# Patient Record
Sex: Female | Born: 2000 | Race: Black or African American | Hispanic: No | Marital: Single | Smoking: Never smoker
Health system: Southern US, Community
[De-identification: ages and names within clinical notes are randomized; demographics above are authoritative.]

## PROBLEM LIST (undated history)

## (undated) ENCOUNTER — Ambulatory Visit: Admission: EM | Payer: BC Managed Care – PPO | Source: Home / Self Care

## (undated) DIAGNOSIS — J302 Other seasonal allergic rhinitis: Secondary | ICD-10-CM

## (undated) DIAGNOSIS — G43909 Migraine, unspecified, not intractable, without status migrainosus: Secondary | ICD-10-CM

## (undated) DIAGNOSIS — T783XXA Angioneurotic edema, initial encounter: Secondary | ICD-10-CM

## (undated) DIAGNOSIS — L509 Urticaria, unspecified: Secondary | ICD-10-CM

## (undated) HISTORY — DX: Angioneurotic edema, initial encounter: T78.3XXA

## (undated) HISTORY — DX: Urticaria, unspecified: L50.9

## (undated) HISTORY — PX: TYMPANOSTOMY TUBE PLACEMENT: SHX32

## (undated) HISTORY — PX: ADENOIDECTOMY: SUR15

---
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
## 2021-01-04 ENCOUNTER — Other Ambulatory Visit: Payer: Self-pay

## 2021-01-04 ENCOUNTER — Emergency Department (HOSPITAL_COMMUNITY)
Admission: EM | Admit: 2021-01-04 | Discharge: 2021-01-04 | Discharge status: Against Medical Advice | Payer: Self-pay | Attending: Emergency Medicine | Admitting: Emergency Medicine

## 2021-01-04 DIAGNOSIS — R1032 Left lower quadrant pain: Secondary | ICD-10-CM | POA: Insufficient documentation

## 2021-01-04 DIAGNOSIS — R1033 Periumbilical pain: Secondary | ICD-10-CM | POA: Diagnosis not present

## 2021-01-04 DIAGNOSIS — R1012 Left upper quadrant pain: Secondary | ICD-10-CM | POA: Diagnosis not present

## 2021-01-04 DIAGNOSIS — R1084 Generalized abdominal pain: Secondary | ICD-10-CM

## 2021-01-04 DIAGNOSIS — R1011 Right upper quadrant pain: Secondary | ICD-10-CM | POA: Diagnosis present

## 2021-01-04 DIAGNOSIS — R1013 Epigastric pain: Secondary | ICD-10-CM | POA: Insufficient documentation

## 2021-01-04 LAB — COMPREHENSIVE METABOLIC PANEL
ALT: 14 U/L (ref 0–44)
AST: 21 U/L (ref 15–41)
Albumin: 4.1 g/dL (ref 3.5–5.0)
Alkaline Phosphatase: 62 U/L (ref 38–126)
Anion gap: 9 (ref 5–15)
BUN: 7 mg/dL (ref 6–20)
CO2: 25 mmol/L (ref 22–32)
Calcium: 9.4 mg/dL (ref 8.9–10.3)
Chloride: 103 mmol/L (ref 98–111)
Creatinine, Ser: 0.78 mg/dL (ref 0.44–1.00)
GFR, Estimated: >60 mL/min (ref >60)
Glucose, Bld: 94 mg/dL (ref 70–99)
Potassium: 4.3 mmol/L (ref 3.5–5.1)
Sodium: 137 mmol/L (ref 135–145)
Total Bilirubin: 0.4 mg/dL (ref 0.3–1.2)
Total Protein: 7.9 g/dL (ref 6.5–8.1)

## 2021-01-04 LAB — I-STAT BETA HCG BLOOD, ED (MC, WL, AP ONLY): I-stat hCG, quantitative: <5 m[IU]/mL (ref <5)

## 2021-01-04 LAB — URINALYSIS, ROUTINE W REFLEX MICROSCOPIC
Bilirubin Urine: NEGATIVE
Glucose, UA: NEGATIVE mg/dL
Hgb urine dipstick: NEGATIVE
Ketones, ur: NEGATIVE mg/dL
Leukocytes,Ua: NEGATIVE
Nitrite: NEGATIVE
Protein, ur: NEGATIVE mg/dL
Specific Gravity, Urine: 1.021 (ref 1.005–1.030)
pH: 6 (ref 5.0–8.0)

## 2021-01-04 LAB — CBC
HCT: 39.3 % (ref 36.0–46.0)
Hemoglobin: 12.1 g/dL (ref 12.0–15.0)
MCH: 24.3 pg — ABNORMAL LOW (ref 26.0–34.0)
MCHC: 30.8 g/dL (ref 30.0–36.0)
MCV: 79.1 fL — ABNORMAL LOW (ref 80.0–100.0)
Platelets: 290 10*3/uL (ref 150–400)
RBC: 4.97 MIL/uL (ref 3.87–5.11)
RDW: 13.4 % (ref 11.5–15.5)
WBC: 8.9 10*3/uL (ref 4.0–10.5)
nRBC: 0 % (ref 0.0–0.2)

## 2021-01-04 LAB — LIPASE, BLOOD: Lipase: 30 U/L (ref 11–51)

## 2021-01-04 MED ORDER — DICYCLOMINE HCL 10 MG PO CAPS: 10.0000 mg | ORAL_CAPSULE | Freq: Once | ORAL | Status: DC

## 2021-01-04 NOTE — ED Triage Notes (Signed)
Pt c/o lower abdominal pain that began two days ago that radiates to upper abdomen. Last BM was yesterday and it was normal.

## 2021-01-04 NOTE — ED Notes (Signed)
Tried calling pt via phone number on chart, no answer, left voicemail.

## 2021-01-04 NOTE — ED Provider Notes (Signed)
[facility] EMERGENCY DEPARTMENT Provider Note   CSN: [id number] Arrival date & time: 01/04/21  0540     History Chief Complaint  Patient presents with  . Abdominal Pain    [place] is a 20 y.o. female 2 days of abdominal pain. Began as sharp, stabbing RUQ pain. The pain migrated to her left lower quadrant. The pain is sharp. Nothing makes it worse or better. She denies fever, nausea, vomiting, diarrhea or constipation. She has irregular periods due to being on Depo-medrol shot.  She has no urinary sxs or back pain. She denies vaginal sxs. She rates pain as 7/10.    HPI     Past Medical History:  Diagnosis Date  . Angio-edema   . Urticaria     Patient Active Problem List   Diagnosis Date Noted  . Urticaria 11/19/2020  . Other allergic rhinitis 11/19/2020  . Dermatographism 11/19/2020    Past Surgical History:  Procedure Laterality Date  . ADENOIDECTOMY    . TYMPANOSTOMY TUBE PLACEMENT       OB History   No obstetric history on file.     Family History  Problem Relation Age of Onset  . Allergic rhinitis Mother   . Allergic rhinitis Father   . Allergic rhinitis Sister   . Urticaria Sister   . Asthma Brother   . Allergic rhinitis Brother   . Allergic rhinitis Maternal Grandmother   . Urticaria Maternal Grandmother   . Eczema Neg Hx     Social History   Tobacco Use  . Smoking status: Never Smoker  . Smokeless tobacco: Never Used  Vaping Use  . Vaping Use: Never used  Substance Use Topics  . Alcohol use: Never  . Drug use: Never    Home Medications Prior to Admission medications   Medication Sig Start Date End Date Taking? Authorizing Provider  cetirizine (ZYRTEC ALLERGY) 10 MG tablet Take 1 tablet (10 mg total) by mouth daily. Patient taking differently: Take 10 mg by mouth 2 (two) times daily. 12/23/20  Yes Ellamae Sia, DO  SUMAtriptan (IMITREX) 100 MG tablet Take 100 mg by mouth once as needed for migraine. May repeat in  2 hours if headache persists or recurs.   Yes [provider]  topiramate (TOPAMAX) 50 MG tablet Take 50 mg by mouth daily.   Yes [provider]  montelukast (SINGULAIR) 10 MG tablet Take 1 tablet (10 mg total) by mouth at bedtime. Patient not taking: Reported on 01/04/2021 12/23/20   Ellamae Sia, DO    Allergies    Patient has no known allergies.  Review of Systems   Review of Systems Ten systems reviewed and are negative for acute change, except as noted in the HPI.   Physical Exam Updated Vital Signs BP 124/86 (BP Location: Left Arm)   Pulse 83   Temp 98.7 F (37.1 C) (Oral)   Resp 16   Ht 5\' 7"  (1.702 m)   Wt 75.8 kg   LMP 11/15/2020   SpO2 99%   BMI 26.16 kg/m   Physical Exam Vitals and nursing note reviewed.  Constitutional:      General: She is not in acute distress.    Appearance: She is well-developed. She is not diaphoretic.  HENT:     Head: Normocephalic and atraumatic.  Eyes:     General: No scleral icterus.    Conjunctiva/sclera: Conjunctivae normal.  Cardiovascular:     Rate and Rhythm: Normal rate and regular rhythm.  Heart sounds: Normal heart sounds. No murmur heard. No friction rub. No gallop.   Pulmonary:     Effort: Pulmonary effort is normal. No respiratory distress.     Breath sounds: Normal breath sounds.  Abdominal:     General: Bowel sounds are normal. There is no distension.     Palpations: Abdomen is soft. There is no mass.     Tenderness: There is abdominal tenderness in the right upper quadrant, epigastric area, periumbilical area, suprapubic area, left upper quadrant and left lower quadrant. There is no guarding.    Musculoskeletal:     Cervical back: Normal range of motion.  Skin:    General: Skin is warm and dry.  Neurological:     Mental Status: She is alert and oriented to person, place, and time.  Psychiatric:        Behavior: Behavior normal.     ED Results / Procedures / Treatments   Labs (all labs  ordered are listed, but only abnormal results are displayed) Labs Reviewed  CBC - Abnormal; Notable for the following components:      Result Value   MCV 79.1 (*)    MCH 24.3 (*)    All other components within normal limits  LIPASE, BLOOD  COMPREHENSIVE METABOLIC PANEL  URINALYSIS, ROUTINE W REFLEX MICROSCOPIC  I-STAT BETA HCG BLOOD, ED ([facility], [facility], [facility] ONLY)    EKG None  Radiology No results found.  Procedures Procedures   Medications Ordered in ED Medications - No data to display  ED Course  I have reviewed the triage vital signs and the nursing notes.  Pertinent labs & imaging results that were available during my care of the patient were reviewed by me and considered in my medical decision making (see chart for details).    MDM Rules/Calculators/A&P                         Patient seen at bedside.  She has fairly benign abdominal exam and pain is mostly generalized. I reviewed the patient's laboratory results which show CBC which shows no elevated white blood cell count, CMP within normal limits, normal urine level, i-STAT hCG is negative.  Patient is well-appearing, afebrile, she has normal vital signs, no active vomiting or diarrhea.  Given the fact that the patient is tender in her left lower quadrant I explained that it we would likely need to do a pelvic examination.  Patient has not had a pelvic examination before but states that it is okay to perform the exam.  I walked the patient through the process of the speculum and bimanual examination. I then ordered standard pelvic labs and checked in with the nurse to make sure she would message me when the exam was ready to go.  She states that the patient appears to have eloped from the room with all of her belongings.  She tried calling the patient who did not pick up her phone.   Final Clinical Impression(s) / ED Diagnoses Final diagnoses:  None    Rx / DC Orders ED Discharge Orders    None       Arthor Captain,  PA-C 01/04/21 0747    Rolan Bucco, MD 01/04/21 [phone]

## 2021-01-04 NOTE — ED Notes (Signed)
PT not in room, or any of the bathrooms in this section of the unit. No belongings left in the room. Believe pt has eloped.

## 2021-01-09 LAB — CBC WITH DIFFERENTIAL/PLATELET
Basophils Absolute: 0 10*3/uL (ref 0.0–0.2)
Basos: 1 %
EOS (ABSOLUTE): 0.2 10*3/uL (ref 0.0–0.4)
Eos: 4 %
Hematocrit: 41.6 % (ref 34.0–46.6)
Hemoglobin: 13.2 g/dL (ref 11.1–15.9)
Immature Grans (Abs): 0 10*3/uL (ref 0.0–0.1)
Immature Granulocytes: 0 %
Lymphocytes Absolute: 2.2 10*3/uL (ref 0.7–3.1)
Lymphs: 51 %
MCH: 24.4 pg — ABNORMAL LOW (ref 26.6–33.0)
MCHC: 31.7 g/dL (ref 31.5–35.7)
MCV: 77 fL — ABNORMAL LOW (ref 79–97)
Monocytes Absolute: 0.3 10*3/uL (ref 0.1–0.9)
Monocytes: 7 %
Neutrophils Absolute: 1.6 10*3/uL (ref 1.4–7.0)
Neutrophils: 37 %
Platelets: 215 10*3/uL (ref 150–450)
RBC: 5.4 x10E6/uL — ABNORMAL HIGH (ref 3.77–5.28)
RDW: 13 % (ref 11.7–15.4)
WBC: 4.2 10*3/uL (ref 3.4–10.8)

## 2021-01-09 LAB — ALLERGENS W/TOTAL IGE AREA 2
Alternaria Alternata IgE: <0.1 kU/L
Aspergillus Fumigatus IgE: <0.1 kU/L
Bermuda Grass IgE: 0.36 kU/L — AB
Cat Dander IgE: <0.1 kU/L
Cedar, Mountain IgE: <0.1 kU/L
Cladosporium Herbarum IgE: <0.1 kU/L
Cockroach, German IgE: 0.63 kU/L — AB
Common Silver Birch IgE: <0.1 kU/L
Cottonwood IgE: <0.1 kU/L
D Farinae IgE: 1.35 kU/L — AB
D Pteronyssinus IgE: 1.38 kU/L — AB
Dog Dander IgE: <0.1 kU/L
Elm, American IgE: <0.1 kU/L
Johnson Grass IgE: 0.13 kU/L — AB
Maple/Box Elder IgE: 0.2 kU/L — AB
Mouse Urine IgE: <0.1 kU/L
Oak, White IgE: <0.1 kU/L
Pecan, Hickory IgE: <0.1 kU/L
Penicillium Chrysogen IgE: <0.1 kU/L
Pigweed, Rough IgE: <0.1 kU/L
Ragweed, Short IgE: 0.2 kU/L — AB
Sheep Sorrel IgE Qn: <0.1 kU/L
Timothy Grass IgE: 0.97 kU/L — AB
White Mulberry IgE: <0.1 kU/L

## 2021-01-09 LAB — ALPHA-GAL PANEL
Allergen Lamb IgE: <0.1 kU/L
Beef IgE: 0.1 kU/L — AB
IgE (Immunoglobulin E), Serum: 439 IU/mL (ref 6–495)
O215-IgE Alpha-Gal: <0.1 kU/L
Pork IgE: 0.31 kU/L — AB

## 2021-01-09 LAB — COMPREHENSIVE METABOLIC PANEL
ALT: 12 IU/L (ref 0–32)
AST: 28 IU/L (ref 0–40)
Albumin/Globulin Ratio: 1.2 (ref 1.2–2.2)
Albumin: 4.4 g/dL (ref 3.9–5.0)
Alkaline Phosphatase: 80 IU/L (ref 42–106)
BUN/Creatinine Ratio: 12 (ref 9–23)
BUN: 10 mg/dL (ref 6–20)
Bilirubin Total: <0.2 mg/dL (ref 0.0–1.2)
CO2: 22 mmol/L (ref 20–29)
Calcium: 9.9 mg/dL (ref 8.7–10.2)
Chloride: 101 mmol/L (ref 96–106)
Creatinine, Ser: 0.82 mg/dL (ref 0.57–1.00)
Globulin, Total: 3.7 g/dL (ref 1.5–4.5)
Glucose: 85 mg/dL (ref 65–99)
Potassium: 4.7 mmol/L (ref 3.5–5.2)
Sodium: 138 mmol/L (ref 134–144)
Total Protein: 8.1 g/dL (ref 6.0–8.5)
eGFR: 106 mL/min/{1.73_m2} (ref >59)

## 2021-01-09 LAB — THYROID CASCADE PROFILE: TSH: 1.32 u[IU]/mL (ref 0.450–4.500)

## 2021-01-09 LAB — ANA W/REFLEX: Anti Nuclear Antibody (ANA): NEGATIVE

## 2021-01-09 LAB — CHRONIC URTICARIA: cu index: 7.3 (ref <10)

## 2021-01-09 LAB — TRYPTASE: Tryptase: 7.3 ug/L (ref 2.2–13.2)

## 2021-03-03 ENCOUNTER — Ambulatory Visit: Payer: BC Managed Care – PPO | Admitting: Allergy

## 2021-03-03 DIAGNOSIS — J309 Allergic rhinitis, unspecified: Secondary | ICD-10-CM

## 2021-03-03 NOTE — Progress Notes (Deleted)
RE: Sonia Cruz MRN: [id number] DOB: 23-Jun-2001 Date of Telemedicine Visit: 03/03/2021  Referring provider: No ref. provider found Primary care provider: Patient, No Pcp Per (Inactive)  Chief Complaint: No chief complaint on file.   Telemedicine Follow Up Visit via Telephone: I connected with Sonia Cruz for a follow up on 03/03/21 by telephone and verified that I am speaking with the correct person using two identifiers.   I discussed the limitations, risks, security and privacy concerns of performing an evaluation and management service by telephone and the availability of in person appointments. I also discussed with the patient that there may be a patient responsible charge related to this service. The patient expressed understanding and agreed to proceed.  Patient is at home/work.  Provider is at home.  Visit start time: *** Visit end time: *** Insurance consent/check in by: front desk Medical consent and medical assistant/nurse: Ashleigh F.   History of Present Illness: She is a 20 y.o. female, who is being followed for urticaria, dermatographism, allergic rhinitis. Her previous allergy office visit was on 12/23/2020 with Dr. Selena Batten. Today is a regular follow up visit.  Please call patient. Blood work was positive to dust mites, grass pollen, cockroach.  Borderline positive to tree pollen and ragweed pollen. Start environmental controlmeasures - please mail to her home.    Blood count, kidney function, liver function, electrolytes, thyroid, autoimmune screener, chronic urticaria index (checks for autoantibodies that trigger mast cells), tryptase (checks for mast cell issues) and alpha gal (checks for redmeat allergy) were all normal which is great.    Borderline positive to pork - does she eat pork right now? Any symptoms?   Urticaria Past history - Pruritic rash started 1 month ago - worse on arms, neck, lowe back and inner thigh. Worse after stroking area. Takes benadryl  prn with good benefit. Denies changes in diet, meds, personal care products or recent infections. Referral note state she had prior COVID-19 infection.  Interim history - resolved with zyrtec 20mg  once a day. Did not get bloodwork yet. DECREASE zyrtec to 10mg  once a day. If hives are not controlled or causes drowsiness let know. Avoid the following potential triggers: alcohol, tight clothing, NSAIDs, hot showers and getting overheated. Get bloodwork to rule out other etiologies.    Dermatographism See assessment and plan as above.   Other allergic rhinitis Past history - Rhinoconjunctivitis symptoms mainly in the winter months.  No prior allergy testing.   Interim history - symptoms flared. Does not tolerate nasal sprays due to epistaxis.  Check environmental allergy panel via bloodwork. Continue with zyrtec 10mg  daily as above.  Nasal saline spray (i.e., Simply Saline) or nasal saline lavage (i.e., NeilMed) is recommended as needed and prior to medicated nasal sprays. Start Singulair (montelukast) 10mg  daily at night. Cautioned that in some children/adults can experience behavioral changes including hyperactivity, agitation, depression, sleep disturbances and suicidal ideations. These side effects are rare, but if you notice them you should notify me and discontinue Singulair (montelukast).   Return in about 2 months (around 02/22/2021).  Assessment and Plan: Sonia Cruz is a 20 y.o. female with: No problem-specific Assessment & Plan notes found for this encounter.  No follow-ups on file.  No orders of the defined types were placed in this encounter.  Lab Orders  No laboratory test(s) ordered today    Diagnostics: None.  Medication List:  Current Outpatient Medications  Medication Sig Dispense Refill   cetirizine (ZYRTEC ALLERGY) 10 MG tablet Take 1 tablet (  10 mg total) by mouth daily. (Patient taking differently: Take 10 mg by mouth 2 (two) times daily.) 30 tablet 5    montelukast (SINGULAIR) 10 MG tablet Take 1 tablet (10 mg total) by mouth at bedtime. (Patient not taking: Reported on 01/04/2021) 30 tablet 5   SUMAtriptan (IMITREX) 100 MG tablet Take 100 mg by mouth once as needed for migraine. May repeat in 2 hours if headache persists or recurs.     topiramate (TOPAMAX) 50 MG tablet Take 50 mg by mouth daily.     No current facility-administered medications for this visit.   Allergies: No Known Allergies I reviewed her past medical history, social history, family history, and environmental history and no significant changes have been reported from her previous visit.  Review of Systems  Constitutional:  Negative for appetite change, chills, fever and unexpected weight change.  HENT:  Positive for congestion and rhinorrhea.   Eyes:  Negative for itching.  Respiratory:  Negative for cough, chest tightness, shortness of breath and wheezing.   Cardiovascular:  Negative for chest pain.  Gastrointestinal:  Negative for abdominal pain.  Genitourinary:  Negative for difficulty urinating.  Skin:  Negative for rash.  Allergic/Immunologic: Positive for environmental allergies.  Neurological:  Negative for headaches.  Objective: Physical Exam Not obtained as encounter was done via telephone.   Previous notes and tests were reviewed.  I discussed the assessment and treatment plan with the patient. The patient was provided an opportunity to ask questions and all were answered. The patient agreed with the plan and demonstrated an understanding of the instructions. After visit summary/patient instructions available via  mail .   The patient was advised to call back or seek an in-person evaluation if the symptoms worsen or if the condition fails to improve as anticipated.  I provided *** minutes of non-face-to-face time during this encounter.  It was my pleasure to participate in Sonia Cruz's care today. Please feel free to contact me with any questions or  concerns.   Sincerely,  Wyline Mood, DO Allergy & Immunology  [facility] office: [phone] [facility] office: [phone]

## 2022-01-02 ENCOUNTER — Emergency Department (HOSPITAL_BASED_OUTPATIENT_CLINIC_OR_DEPARTMENT_OTHER)
Admission: EM | Admit: 2022-01-02 | Discharge: 2022-01-02 | Disposition: A | Discharge status: Home / Self Care | Payer: BC Managed Care – PPO | Attending: Emergency Medicine | Admitting: Emergency Medicine

## 2022-01-02 ENCOUNTER — Other Ambulatory Visit: Payer: Self-pay

## 2022-01-02 ENCOUNTER — Encounter (HOSPITAL_BASED_OUTPATIENT_CLINIC_OR_DEPARTMENT_OTHER): Payer: Self-pay

## 2022-01-02 ENCOUNTER — Emergency Department (HOSPITAL_BASED_OUTPATIENT_CLINIC_OR_DEPARTMENT_OTHER): Payer: BC Managed Care – PPO

## 2022-01-02 DIAGNOSIS — J40 Bronchitis, not specified as acute or chronic: Secondary | ICD-10-CM | POA: Insufficient documentation

## 2022-01-02 DIAGNOSIS — R051 Acute cough: Secondary | ICD-10-CM

## 2022-01-02 HISTORY — DX: Other seasonal allergic rhinitis: J30.2

## 2022-01-02 HISTORY — DX: Migraine, unspecified, not intractable, without status migrainosus: G43.909

## 2022-01-02 LAB — CBC WITH DIFFERENTIAL/PLATELET
Abs Immature Granulocytes: 0.03 10*3/uL (ref 0.00–0.07)
Basophils Absolute: 0.1 10*3/uL (ref 0.0–0.1)
Basophils Relative: 1 %
Eosinophils Absolute: 0.2 10*3/uL (ref 0.0–0.5)
Eosinophils Relative: 3 %
HCT: 40.1 % (ref 36.0–46.0)
Hemoglobin: 12.8 g/dL (ref 12.0–15.0)
Immature Granulocytes: 0 %
Lymphocytes Relative: 27 %
Lymphs Abs: 2.3 10*3/uL (ref 0.7–4.0)
MCH: 25.7 pg — ABNORMAL LOW (ref 26.0–34.0)
MCHC: 31.9 g/dL (ref 30.0–36.0)
MCV: 80.4 fL (ref 80.0–100.0)
Monocytes Absolute: 0.8 10*3/uL (ref 0.1–1.0)
Monocytes Relative: 9 %
Neutro Abs: 5.2 10*3/uL (ref 1.7–7.7)
Neutrophils Relative %: 60 %
Platelets: 315 10*3/uL (ref 150–400)
RBC: 4.99 MIL/uL (ref 3.87–5.11)
RDW: 12.8 % (ref 11.5–15.5)
WBC: 8.7 10*3/uL (ref 4.0–10.5)
nRBC: 0 % (ref 0.0–0.2)

## 2022-01-02 LAB — BASIC METABOLIC PANEL
Anion gap: 6 (ref 5–15)
BUN: 12 mg/dL (ref 6–20)
CO2: 25 mmol/L (ref 22–32)
Calcium: 9.1 mg/dL (ref 8.9–10.3)
Chloride: 106 mmol/L (ref 98–111)
Creatinine, Ser: 0.79 mg/dL (ref 0.44–1.00)
GFR, Estimated: >60 mL/min (ref >60)
Glucose, Bld: 96 mg/dL (ref 70–99)
Potassium: 3.7 mmol/L (ref 3.5–5.1)
Sodium: 137 mmol/L (ref 135–145)

## 2022-01-02 LAB — D-DIMER, QUANTITATIVE: D-Dimer, Quant: <0.27 ug/mL-FEU (ref 0.00–0.50)

## 2022-01-02 LAB — TROPONIN I (HIGH SENSITIVITY): Troponin I (High Sensitivity): 4 ng/L (ref <18)

## 2022-01-02 MED ORDER — ALBUTEROL SULFATE HFA 108 (90 BASE) MCG/ACT IN AERS
2.0000 | INHALATION_SPRAY | RESPIRATORY_TRACT | Status: DC | PRN
  Administered 2022-01-02: 2 via RESPIRATORY_TRACT
  Filled 2022-01-02: qty 6.7

## 2022-01-02 NOTE — ED Triage Notes (Signed)
Pt reports allergies last week . Now has a productive cough. Chest feels tight with pain in center that's goes into back.  ?

## 2022-01-02 NOTE — ED Notes (Signed)
Pt A&Ox4 ambulatory at d/c with independent steady gait. Pt verbalized understanding of d/c instructions and follow up care. 

## 2022-01-02 NOTE — Discharge Instructions (Signed)
Please read and follow all provided instructions. ? ?Your diagnoses today include:  ?1. Bronchitis   ?2. Acute cough   ? ? ?Tests performed today include: ?Chest x-ray - does not show any pneumonia or other problems ?EKG: Shows nonspecific T wave changes but no signs of heart attack or other problems with the heart ?Complete blood cell count: No problems ?Basic metabolic panel: No problems ?Cardiac enzymes (blood test looking for stress on the heart): Was normal ?Vital signs. See below for your results today.  ? ?Medications prescribed:  ?Albuterol inhaler - medication that opens up your airway ? ?Use inhaler as follows: 1-2 puffs with spacer every 4 hours as needed for wheezing, cough, or shortness of breath.  ? ?Take any prescribed medications only as directed. ? ?Home care instructions:  ?Follow any educational materials contained in this packet. ? ?Continue your home antihistamine and use any over-the-counter medications as directed for symptom control. ? ?Follow-up instructions: ?Please follow-up with your primary care provider in the next 5 days for further evaluation of your symptoms and a recheck if you are not feeling better.  ? ?Return instructions:  ?Please return to the Emergency Department if you experience worsening symptoms. ?Please return with worsening wheezing, shortness of breath, or difficulty breathing. ?Return with persistent fever above 101F.  ?Please return if you have any other emergent concerns. ? ?Additional Information: ? ?Your vital signs today were: ?BP 119/74 (BP Location: Left Arm)   Pulse (!) 102   Temp 98.5 ?F (36.9 ?C) (Oral)   Resp 18   Ht 5\' 6"  (1.676 m)   Wt 82.6 kg   LMP 12/29/2021   SpO2 99%   BMI 29.38 kg/m?  ?If your blood pressure (BP) was elevated above 135/85 this visit, please have this repeated by your doctor within one month. ?-------------- ? ?

## 2022-01-02 NOTE — ED Provider Notes (Signed)
?[facility] EMERGENCY DEPARTMENT ?Provider Note ? ? ?CSN: [id number] ?Arrival date & time: 01/02/22  1559 ? ?  ? ?History ? ?Chief Complaint  ?Patient presents with  ? Cough  ? ? ?Sonia Cruz is a 21 y.o. female. ? ?Patient with no significant past medical history, history of seasonal allergies on Xyzal presents to the emergency department for evaluation of chest pain and cough.  Patient reports having sinusitis type symptoms about a week to 2 weeks ago which improved spontaneously.  She has developed a sharp central chest pain which radiates to her back at times.  It is constant but worse with coughing and taking a deep breath.  She has had some yellowish sputum production without blood.  She is not short of breath.  No fevers, nausea or vomiting. Patient denies risk factors for pulmonary embolism including: unilateral leg swelling, history of DVT/PE/other blood clots, use of exogenous hormones, recent immobilizations, recent surgery, recent travel (>4hr segment), malignancy, hemoptysis.  Vapes occasionally, no tobacco or marijuana use.  Family history of stroke and heart attack in a paternal grandfather at around age 7. ? ? ?  ? ?Home Medications ?Prior to Admission medications   ?Medication Sig Start Date End Date Taking? Authorizing Provider  ?cetirizine (ZYRTEC ALLERGY) 10 MG tablet Take 1 tablet (10 mg total) by mouth daily. ?Patient taking differently: Take 10 mg by mouth 2 (two) times daily. 12/23/20   Ellamae Sia, DO  ?montelukast (SINGULAIR) 10 MG tablet Take 1 tablet (10 mg total) by mouth at bedtime. ?Patient not taking: Reported on 01/04/2021 12/23/20   Ellamae Sia, DO  ?SUMAtriptan (IMITREX) 100 MG tablet Take 100 mg by mouth once as needed for migraine. May repeat in 2 hours if headache persists or recurs.    [provider]  ?topiramate (TOPAMAX) 50 MG tablet Take 50 mg by mouth daily.    [provider]  ?   ? ?Allergies    ?Patient has no known allergies.   ? ?Review of  Systems   ?Review of Systems ? ?Physical Exam ?Updated Vital Signs ?BP 119/74 (BP Location: Left Arm)   Pulse (!) 102   Temp 98.5 ?F (36.9 ?C) (Oral)   Resp 18   Ht 5\' 6"  (1.676 m)   Wt 82.6 kg   LMP 12/29/2021   SpO2 99%   BMI 29.38 kg/m?  ?Physical Exam ?Vitals and nursing note reviewed.  ?Constitutional:   ?   Appearance: She is well-developed. She is not diaphoretic.  ?HENT:  ?   Head: Normocephalic and atraumatic.  ?   Nose: No congestion or rhinorrhea.  ?   Mouth/Throat:  ?   Mouth: Mucous membranes are not dry.  ?Eyes:  ?   Conjunctiva/sclera: Conjunctivae normal.  ?Neck:  ?   Vascular: Normal carotid pulses. No JVD.  ?   Trachea: Trachea normal. No tracheal deviation.  ?Cardiovascular:  ?   Rate and Rhythm: Normal rate and regular rhythm.  ?   Pulses: No decreased pulses.     ?     Radial pulses are 2+ on the right side and 2+ on the left side.  ?   Heart sounds: Normal heart sounds, S1 normal and S2 normal. No murmur heard. ?Pulmonary:  ?   Effort: Pulmonary effort is normal. No respiratory distress.  ?   Breath sounds: No wheezing.  ?   Comments: No wheezing, crackles or other abnormal sounds. ?Chest:  ?   Chest wall: No tenderness.  ?  Abdominal:  ?   General: Bowel sounds are normal.  ?   Palpations: Abdomen is soft.  ?   Tenderness: There is no abdominal tenderness. There is no guarding or rebound.  ?Musculoskeletal:     ?   General: Normal range of motion.  ?   Cervical back: Normal range of motion and neck supple. No muscular tenderness.  ?Skin: ?   General: Skin is warm and dry.  ?   Coloration: Skin is not pale.  ?Neurological:  ?   Mental Status: She is alert.  ? ? ?ED Results / Procedures / Treatments   ?Labs ?(all labs ordered are listed, but only abnormal results are displayed) ?Labs Reviewed  ?CBC WITH DIFFERENTIAL/PLATELET - Abnormal; Notable for the following components:  ?    Result Value  ? MCH 25.7 (*)   ? All other components within normal limits  ?BASIC METABOLIC PANEL  ?D-DIMER,  QUANTITATIVE  ?TROPONIN I (HIGH SENSITIVITY)  ? ? ?ED ECG REPORT ? ? Date: 01/02/2022 ? Rate: 86 ? Rhythm: normal sinus rhythm ? QRS Axis: normal ? Intervals: normal ? ST/T Wave abnormalities: nonspecific T wave changes ? Conduction Disutrbances:none ? Narrative Interpretation:  ? Old EKG Reviewed: none available ? ?I have personally reviewed the EKG tracing and agree with the computerized printout as noted. ? ? ?Radiology ?DG Chest 2 View ? ?Result Date: 01/02/2022 ?CLINICAL DATA:  Cough and chest pain EXAM: CHEST - 2 VIEW COMPARISON:  None. FINDINGS: The heart size and mediastinal contours are within normal limits. Both lungs are clear. The visualized skeletal structures are unremarkable. IMPRESSION: No active cardiopulmonary disease. Electronically Signed   By: Judie PetitM.  Shick M.D.   On: 01/02/2022 16:44   ? ?Procedures ?Procedures  ? ? ?Medications Ordered in ED ?Medications - No data to display ? ?ED Course/ Medical Decision Making/ A&P ?  ? ?Patient seen and examined. History obtained directly from patient. Work-up including labs, imaging, EKG ordered in triage, if performed, were reviewed.   ? ?Labs/EKG: Ordered CBC, BMP, troponin, d-dimer ? ?Imaging: Independently visualized and interpreted.  This included: Chest x-ray, agree no acute findings ? ?Medications/Fluids: None ordered. ? ?Most recent vital signs reviewed and are as follows: ?BP 119/74 (BP Location: Left Arm)   Pulse (!) 102   Temp 98.5 ?F (36.9 ?C) (Oral)   Resp 18   Ht 5\' 6"  (1.676 m)   Wt 82.6 kg   LMP 12/29/2021   SpO2 99%   BMI 29.38 kg/m?  ? ?Initial impression: Chest pain, cough, allergic symptoms.  ? ?I had a shared decision-making discussion with patient as well as mother who is present over the telephone.  She works in cardiology.  We discussed nonspecific EKG findings without old to compare.  Discussed work-up with labs, troponin, D-dimer --although symptoms are consistent with an allergic bronchitis and it would be reasonable to treat  this as well.  Patient and family would prefer to have additional testing performed to rule out other etiologies.  Labs ordered as above. ? ?5:57 PM Reassessment performed. Patient appears comfortable.  Lungs remain clear on reexam.  Will provide with albuterol inhaler to use for worsening shortness of breath or chest tightness, wheezing at home. ? ?Labs personally reviewed and interpreted including: CBC unremarkable; BMP unremarkable; D-dimer negative; troponin normal. ? ?Reviewed pertinent lab work and imaging with patient at bedside. Questions answered.  ? ?Most current vital signs reviewed and are as follows: ?BP 119/74 (BP Location: Left Arm)   Pulse Marland Kitchen(!)  102   Temp 98.5 ?F (36.9 ?C) (Oral)   Resp 18   Ht 5\' 6"  (1.676 m)   Wt 82.6 kg   LMP 12/29/2021   SpO2 99%   BMI 29.38 kg/m?  ? ?Plan: Discharge to home.  ? ?Prescriptions written for: None, patient declines prescription for cough medication ? ?Other home care instructions discussed: Avoidance of triggers ? ?ED return instructions discussed: Worsening shortness of breath, chest pain. ? ?Follow-up instructions discussed: Patient encouraged to follow-up with their PCP in 3 days.  ? ? ?                        ?Medical Decision Making ?Amount and/or Complexity of Data Reviewed ?Labs: ordered. ?Radiology: ordered. ? ? ?For this patient's complaint of chest pain/tightness, the following emergent conditions were considered on the differential diagnosis: acute coronary syndrome, pulmonary embolism, pneumothorax, myocarditis, pericardial tamponade, aortic dissection, thoracic aortic aneurysm complication, esophageal perforation.  ? ?Other causes were also considered including: gastroesophageal reflux disease, musculoskeletal pain including costochondritis, pneumonia/pleurisy, herpes zoster, pericarditis. ? ?In regards to possibility of ACS, patient has atypical features of pain, non-ischemic and unchanged EKG and negative troponin(s). Heart score was calculated  to be 2.  ? ?In regards to possibility of PE, symptoms are atypical for PE and risk profile is low with negative d-dimer, making PE low likelihood.  ? ?The patient's vital signs, pertinent lab work and imaging were reviewed

## 2022-03-18 ENCOUNTER — Other Ambulatory Visit: Payer: Self-pay

## 2022-03-18 ENCOUNTER — Encounter (HOSPITAL_COMMUNITY): Payer: Self-pay

## 2022-03-18 ENCOUNTER — Emergency Department (HOSPITAL_COMMUNITY)
Admission: EM | Admit: 2022-03-18 | Discharge: 2022-03-18 | Disposition: A | Discharge status: Home / Self Care | Payer: BC Managed Care – PPO | Attending: Emergency Medicine | Admitting: Emergency Medicine

## 2022-03-18 ENCOUNTER — Emergency Department (HOSPITAL_COMMUNITY): Payer: BC Managed Care – PPO

## 2022-03-18 DIAGNOSIS — W228XXA Striking against or struck by other objects, initial encounter: Secondary | ICD-10-CM | POA: Insufficient documentation

## 2022-03-18 DIAGNOSIS — S8002XA Contusion of left knee, initial encounter: Secondary | ICD-10-CM | POA: Diagnosis not present

## 2022-03-18 DIAGNOSIS — Y990 Civilian activity done for income or pay: Secondary | ICD-10-CM | POA: Insufficient documentation

## 2022-03-18 DIAGNOSIS — S8992XA Unspecified injury of left lower leg, initial encounter: Secondary | ICD-10-CM | POA: Diagnosis present

## 2022-03-18 NOTE — ED Triage Notes (Signed)
Pt presents to ED from work, states she hit her knee three times tonight while working. Pt endorses pain, worsening with ambulation. Hx of prior knee dislocation.

## 2022-03-18 NOTE — ED Provider Notes (Signed)
[facility]-EMERGENCY DEPT Provider Note   CSN: [id number] Arrival date & time: 03/18/22  2033     History  Chief Complaint  Patient presents with   Knee Injury    Sonia Cruz is a 21 y.o. female.  Patient presents to the emergency department today for evaluation of knee pain.  Patient reports hitting her knee approximately 3 times over an hour and a half while at work.  She struck the patella area each time.  She was concerned because she has a history of a patellar dislocation that required rehab.  She is currently able to bear weight with a limp.  She did not feel like her patella shifted.  No distal numbness or tingling.  She has had pain over the mid patella and a burning sensation that radiates down her leg.  No treatments prior to arrival.       Home Medications Prior to Admission medications   Medication Sig Start Date End Date Taking? Authorizing Provider  montelukast (SINGULAIR) 10 MG tablet Take 1 tablet (10 mg total) by mouth at bedtime. Patient not taking: Reported on 01/04/2021 12/23/20   Ellamae Sia, DO  SUMAtriptan (IMITREX) 100 MG tablet Take 100 mg by mouth once as needed for migraine. May repeat in 2 hours if headache persists or recurs.    [provider]  topiramate (TOPAMAX) 50 MG tablet Take 50 mg by mouth daily.    [provider]      Allergies    Patient has no known allergies.    Review of Systems   Review of Systems  Physical Exam Updated Vital Signs BP (!) 134/91   Pulse 78   Temp 98.6 F (37 C) (Oral)   Resp 18   Ht 5\' 6"  (1.676 m)   Wt 82.6 kg   SpO2 99%   BMI 29.39 kg/m   Physical Exam Vitals and nursing note reviewed.  Constitutional:      Appearance: She is well-developed.  HENT:     Head: Normocephalic and atraumatic.  Eyes:     Pupils: Pupils are equal, round, and reactive to light.  Cardiovascular:     Pulses: Normal pulses. No decreased pulses.  Musculoskeletal:        General:  Tenderness present.     Cervical back: Normal range of motion and neck supple.     Right knee: No tenderness.     Left knee: Bony tenderness (patella) present. Normal range of motion. Tenderness present.     Comments: Lower extremity is neurovascularly intact.  No signs of calf swelling or compartment syndrome.  2+ DP pulse on the left.  Skin:    General: Skin is warm and dry.  Neurological:     Mental Status: She is alert.     Sensory: No sensory deficit.     Comments: Motor, sensation, and vascular distal to the injury is fully intact.   Psychiatric:        Mood and Affect: Mood normal.     ED Results / Procedures / Treatments   Labs (all labs ordered are listed, but only abnormal results are displayed) Labs Reviewed - No data to display  EKG None  Radiology DG Knee Complete 4 Views Left  Result Date: 03/18/2022 CLINICAL DATA:  Knee pain EXAM: LEFT KNEE - COMPLETE 4+ VIEW COMPARISON:  None Available. FINDINGS: No evidence of fracture, dislocation, or joint effusion. No evidence of arthropathy or other focal bone abnormality. Soft tissues are unremarkable.  IMPRESSION: Negative. Electronically Signed   By: Jasmine Pang M.D.   On: 03/18/2022 21:12    Procedures Procedures    Medications Ordered in ED Medications - No data to display  ED Course/ Medical Decision Making/ A&P    Patient seen and examined. History obtained directly from patient. Work-up including labs, imaging, EKG ordered in triage, if performed, were reviewed.    Labs/EKG: None ordered  Imaging: Independently reviewed and interpreted.  This included: X-ray of the knee, agree negative for fracture or dislocation  Medications/Fluids: None ordered  Most recent vital signs reviewed and are as follows: BP (!) 134/91   Pulse 78   Temp 98.6 F (37 C) (Oral)   Resp 18   Ht 5\' 6"  (1.676 m)   Wt 82.6 kg   SpO2 99%   BMI 29.39 kg/m   Initial impression: Left knee contusion  Home treatment plan: RICE  protocol, NSAIDs, Tylenol  Return instructions discussed with patient: Worsening pain, swelling or other concerns  Follow-up instructions discussed with patient: PCP in 1 week as needed                          Medical Decision Making Amount and/or Complexity of Data Reviewed Radiology: ordered.   Patient with contusion to the left knee.  No fractures on x-ray.  No patellar dislocation.  No knee dislocation.  Lower extremity is neurovascularly intact.  No concern for major vascular injury.        Final Clinical Impression(s) / ED Diagnoses Final diagnoses:  Contusion of left knee, initial encounter    Rx / DC Orders ED Discharge Orders     None         , Renne Crigler 03/18/22 2217    [id number], MD 03/19/22 1454

## 2022-03-18 NOTE — Discharge Instructions (Signed)
Please read and follow all provided instructions.  Your diagnoses today include:  1. Contusion of left knee, initial encounter     Tests performed today include: An x-ray of the affected area - does NOT show any broken bones Vital signs. See below for your results today.   Medications prescribed:  Ibuprofen (Motrin, Advil) - anti-inflammatory pain medication Do not exceed 600mg  ibuprofen every 6 hours, take with food  You have been prescribed an anti-inflammatory medication or NSAID. Take with food. Take smallest effective dose for the shortest duration needed for your pain. Stop taking if you experience stomach pain or vomiting.   Take any prescribed medications only as directed.  Home care instructions:  Follow any educational materials contained in this packet Follow R.I.C.E. Protocol: R - rest your injury  I  - use ice on injury without applying directly to skin C - compress injury with bandage or splint E - elevate the injury as much as possible  Follow-up instructions: Please follow-up with your primary care provider if you continue to have significant pain in 1 week. In this case you may have a more severe injury that requires further care.   Return instructions:  Please return if your toes or feet are numb or tingling, appear gray or blue, or you have severe pain (also elevate the leg and loosen splint or wrap if you were given one) Please return to the Emergency Department if you experience worsening symptoms.  Please return if you have any other emergent concerns.  Additional Information:  Your vital signs today were: BP (!) 134/91   Pulse 78   Temp 98.6 F (37 C) (Oral)   Resp 18   Ht 5\' 6"  (1.676 m)   Wt 82.6 kg   SpO2 99%   BMI 29.39 kg/m  If your blood pressure (BP) was elevated above 135/85 this visit, please have this repeated by your doctor within one month. --------------

## 2023-07-03 IMAGING — CR DG CHEST 2V
2 series · 2 of 2 positions shown · non-contrast
Comparison: None.

CLINICAL DATA: Cough and chest pain

EXAM:
CHEST - 2 VIEW

[w chest pa]
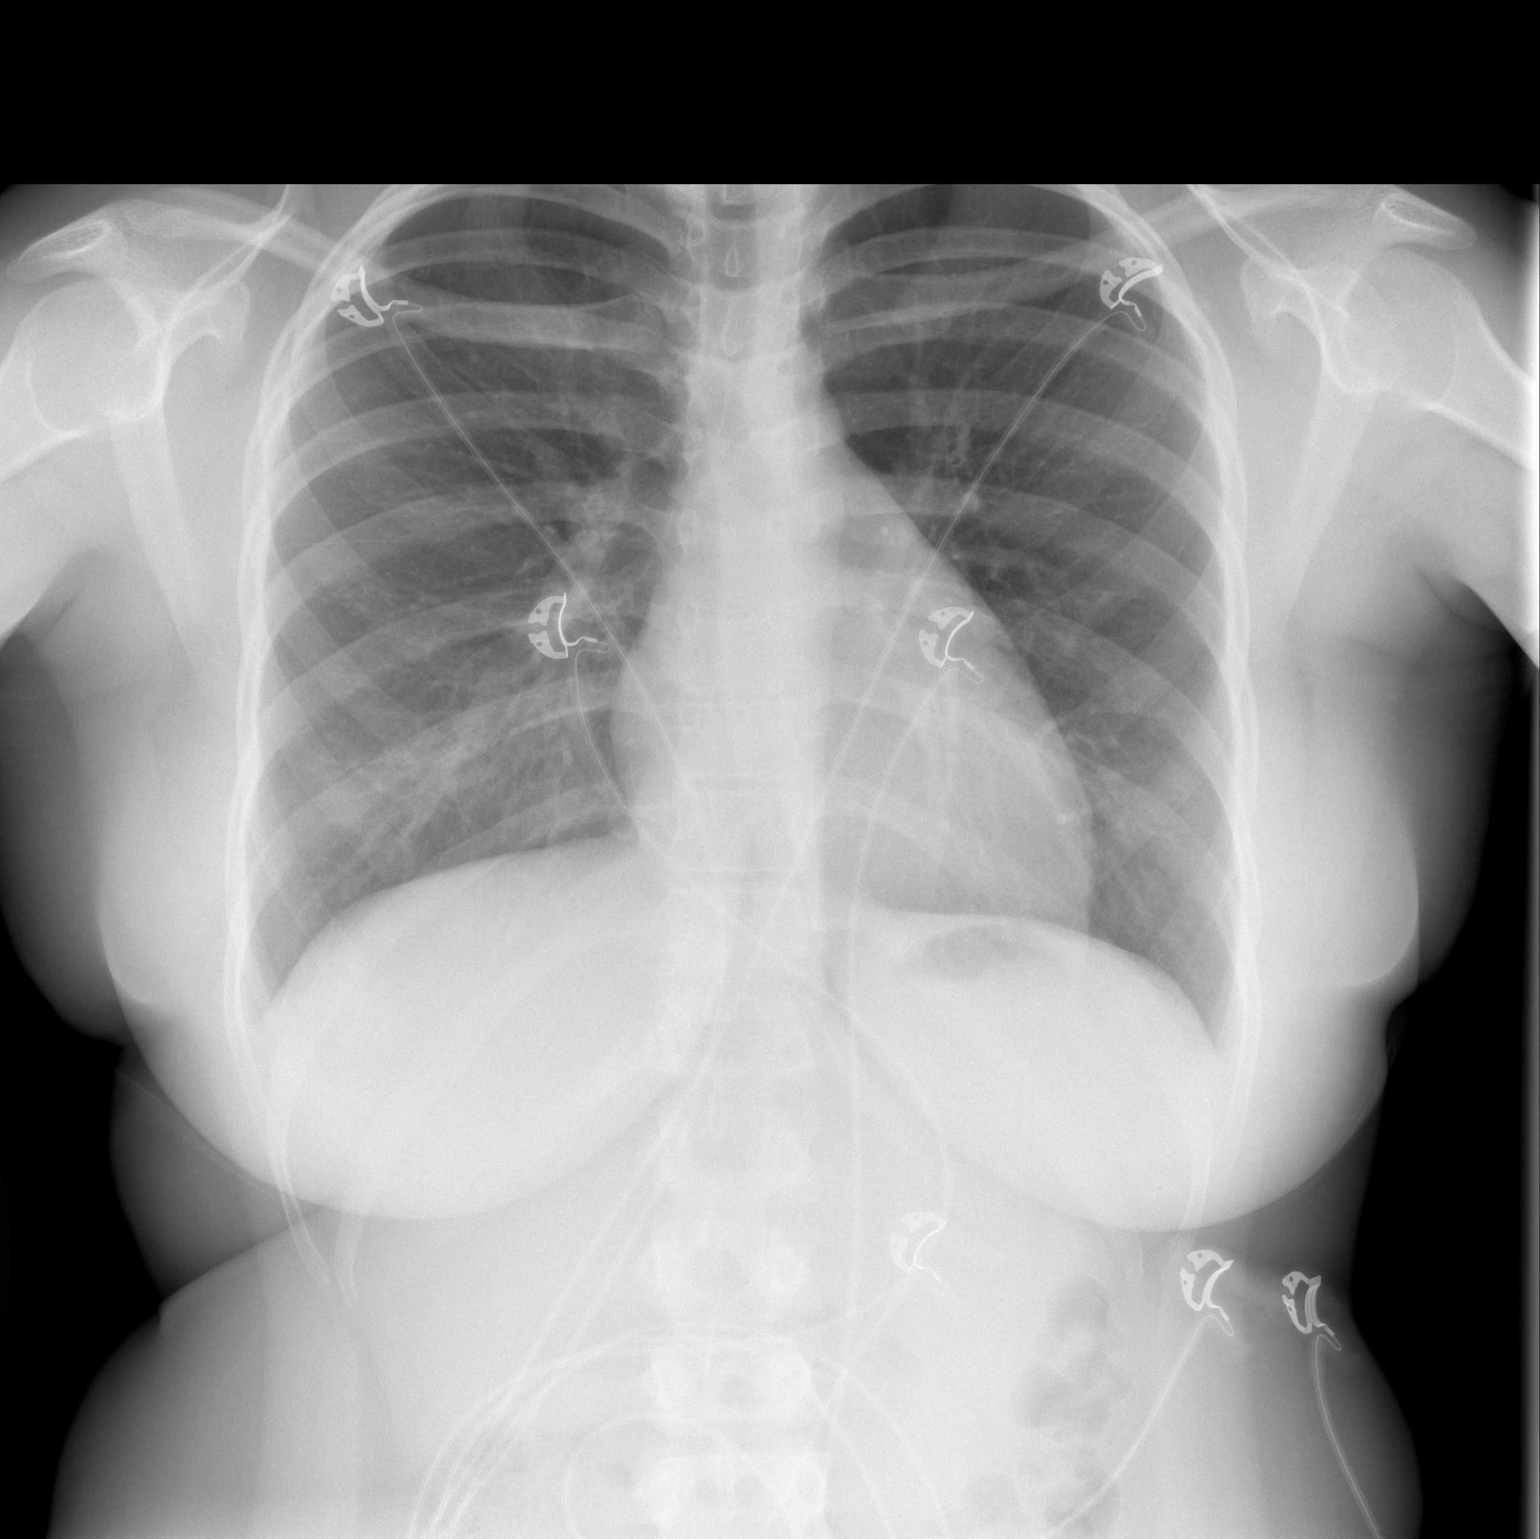

[w chest lat]
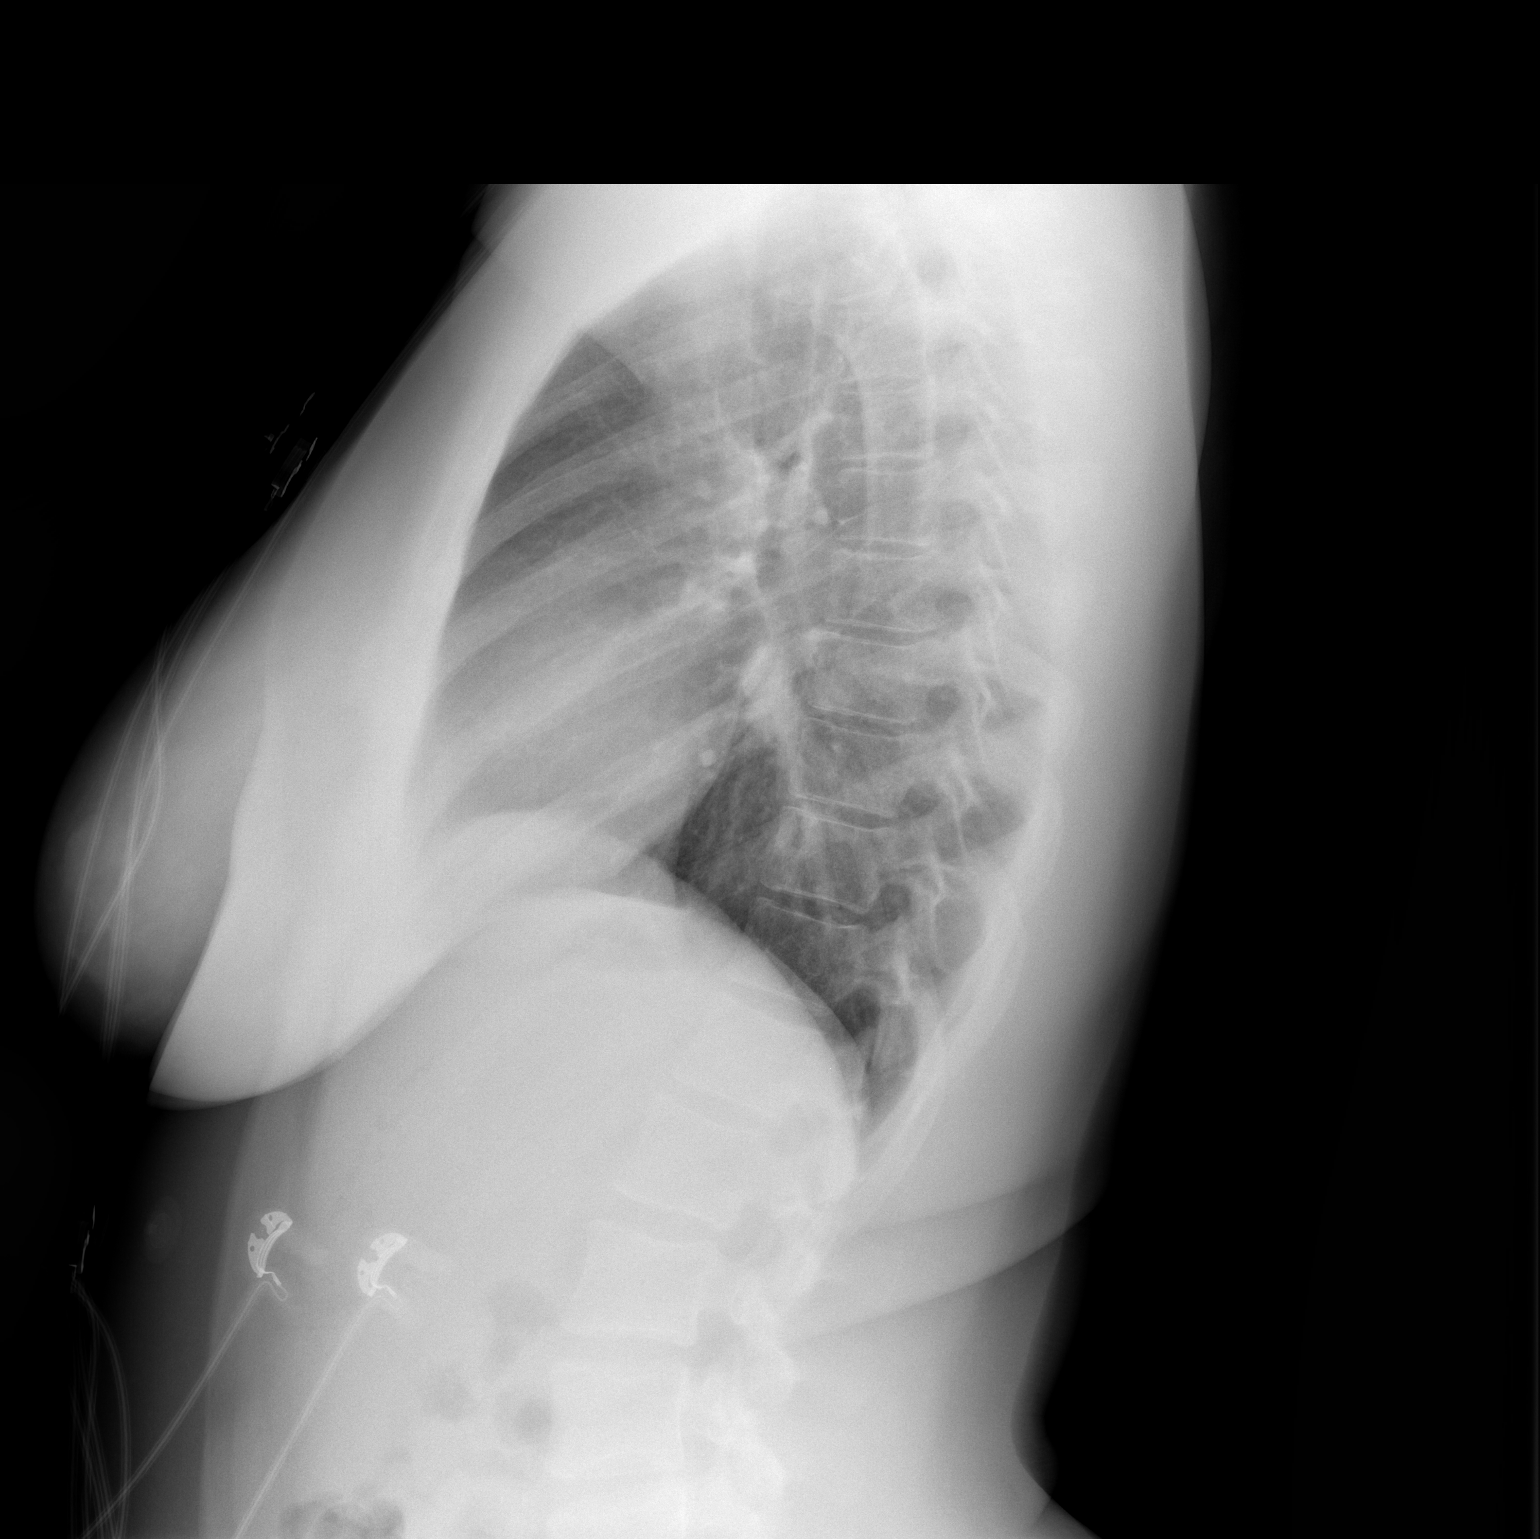

[2 of 2 positions shown; findings below may reference images not displayed]

FINDINGS: The heart size and mediastinal contours are within normal limits.
Both lungs are clear. The visualized skeletal structures are
unremarkable.
IMPRESSION: No active cardiopulmonary disease.

## 2023-08-15 ENCOUNTER — Ambulatory Visit
Admission: EM | Admit: 2023-08-15 | Discharge: 2023-08-15 | Disposition: A | Discharge status: Home / Self Care | Payer: BC Managed Care – PPO | Attending: Family Medicine | Admitting: Family Medicine

## 2023-08-15 DIAGNOSIS — M62830 Muscle spasm of back: Secondary | ICD-10-CM

## 2023-08-15 MED ORDER — NAPROXEN 500 MG PO TABS: 500.0000 mg | ORAL_TABLET | Freq: Two times a day (BID) | ORAL | 0 refills | Status: DC

## 2023-08-15 MED ORDER — CYCLOBENZAPRINE HCL 5 MG PO TABS
5.0000 mg | ORAL_TABLET | Freq: Three times a day (TID) | ORAL | 0 refills | Status: DC | PRN
Start: 2023-08-15 — End: 2024-05-25

## 2023-08-15 NOTE — ED Triage Notes (Addendum)
Pt c/o mid/lower back pain x 2 days-pain worse with movement-denies injury-taking tylenol and ibuprofen-NAD-steady gait

## 2023-08-15 NOTE — ED Provider Notes (Signed)
[facility] - URGENT [facility]ARE C[facility]  Note:  This document was prepared using Conservation officer, historic buildings and may include unintentional dictation errors.  MRN: [id number] DOB: May 09, 2001  Subjective:   Sonia Cruz is a 22 y.o. female presenting for 2-day history of acute onset persistent bilateral lower back pain, tightness and stiffness.  Patient recently traveled to [place].  She had a long drive and quick turnaround.  After the second drive back, had significant worsening of her back pain.  No fall, trauma, numbness or tingling, saddle paresthesia, changes to bowel or urinary habits, radicular symptoms.   No current facility-administered medications for this encounter.  Current Outpatient Medications:    montelukast (SINGULAIR) 10 MG tablet, Take 1 tablet (10 mg total) by mouth at bedtime. (Patient not taking: Reported on 01/04/2021), Disp: 30 tablet, Rfl: 5   SUMAtriptan (IMITREX) 100 MG tablet, Take 100 mg by mouth once as needed for migraine. May repeat in 2 hours if headache persists or recurs., Disp: , Rfl:    topiramate (TOPAMAX) 50 MG tablet, Take 50 mg by mouth daily., Disp: , Rfl:    Allergies  Allergen Reactions   Pork-Derived Products Hives    Past Medical History:  Diagnosis Date   Angio-edema    Migraines    Seasonal allergies    Urticaria      Past Surgical History:  Procedure Laterality Date   ADENOIDECTOMY     TYMPANOSTOMY TUBE PLACEMENT      Family History  Problem Relation Age of Onset   Allergic rhinitis Mother    Allergic rhinitis Father    Allergic rhinitis Sister    Urticaria Sister    Asthma Brother    Allergic rhinitis Brother    Allergic rhinitis Maternal Grandmother    Urticaria Maternal Grandmother    Eczema Neg Hx     Social History   Tobacco Use   Smoking status: Never   Smokeless tobacco: Never  Vaping Use   Vaping status: Every Day  Substance Use Topics   Alcohol use: Yes    Comment: occ   Drug use: Never     ROS   Objective:   Vitals: BP 134/83 (BP Location: Right Arm)   Pulse 81   Temp 98.1 F (36.7 C) (Oral)   Resp 20   SpO2 98%   Physical Exam Constitutional:      General: She is not in acute distress.    Appearance: Normal appearance. She is well-developed. She is not ill-appearing, toxic-appearing or diaphoretic.  HENT:     Head: Normocephalic and atraumatic.     Nose: Nose normal.     Mouth/Throat:     Mouth: Mucous membranes are moist.  Eyes:     General: No scleral icterus.       Right eye: No discharge.        Left eye: No discharge.     Extraocular Movements: Extraocular movements intact.  Cardiovascular:     Rate and Rhythm: Normal rate.  Pulmonary:     Effort: Pulmonary effort is normal.  Musculoskeletal:     Lumbar back: Spasms and tenderness (on either side of the lumbar region; no midline tenderness) present. No swelling, edema, deformity, signs of trauma, lacerations or bony tenderness. Normal range of motion. Negative right straight leg raise test and negative left straight leg raise test. No scoliosis.  Skin:    General: Skin is warm and dry.  Neurological:     General: No focal deficit present.  Mental Status: She is alert and oriented to person, place, and time.  Psychiatric:        Mood and Affect: Mood normal.        Behavior: Behavior normal.     Assessment and Plan :   PDMP not reviewed this encounter.  1. Spasm of muscle of lower back    Deferred imaging given lack of trauma. Will manage conservatively for back strain with NSAID and muscle relaxant, rest and modification of physical activity.  Anticipatory guidance provided.  Counseled patient on potential for adverse effects with medications prescribed/recommended today, ER and return-to-clinic precautions discussed, patient verbalized understanding.    Wallis Bamberg, PA-C 08/15/23 1248

## 2023-09-21 ENCOUNTER — Emergency Department (HOSPITAL_COMMUNITY)
Admission: EM | Admit: 2023-09-21 | Discharge: 2023-09-21 | Discharge status: Against Medical Advice | Payer: BC Managed Care – PPO | Attending: Emergency Medicine | Admitting: Emergency Medicine

## 2023-09-21 ENCOUNTER — Encounter (HOSPITAL_COMMUNITY): Payer: Self-pay

## 2023-09-21 ENCOUNTER — Other Ambulatory Visit: Payer: Self-pay

## 2023-09-21 ENCOUNTER — Emergency Department (HOSPITAL_COMMUNITY): Payer: BC Managed Care – PPO

## 2023-09-21 DIAGNOSIS — M25561 Pain in right knee: Secondary | ICD-10-CM | POA: Insufficient documentation

## 2023-09-21 DIAGNOSIS — M25461 Effusion, right knee: Secondary | ICD-10-CM | POA: Insufficient documentation

## 2023-09-21 DIAGNOSIS — Z5321 Procedure and treatment not carried out due to patient leaving prior to being seen by health care provider: Secondary | ICD-10-CM | POA: Insufficient documentation

## 2023-09-21 NOTE — ED Triage Notes (Signed)
 Pt reports with right knee pain and swelling. Pt reports a throbbing pain. Pt ambulatory to triage.

## 2023-11-28 NOTE — Progress Notes (Signed)
 Assessment:   1. Strep pharyngitis   2. Fever, unspecified fever cause   3. Sore throat   4. Encounter for screening for COVID-19       Plan:   1. Labs and medications ordered today:  Patient's Medications  New Prescriptions   PENICILLIN V POTASSIUM (VEETID) 500 MG TABLET    Take one tablet (500 mg dose) by mouth 2 (two) times daily for 10 days.     Outpatient Encounter Medications as of 11/28/2023  Medication Sig Dispense Refill  . etonogestrel (NEXPLANON) [place] implant Inject one each into the skin once.    . penicillin v potassium (VEETID) 500 MG tablet Take one tablet (500 mg dose) by mouth 2 (two) times daily for 10 days. 20 tablet 0   No facility-administered encounter medications on file as of 11/28/2023.    Change your toothbrush in 24 hours. Acetaminophen 1000mg  and ibuprofen 600-800mg  every 8 hours as needed for pain if not allergic Aloe vera juice for sore throat.  Hot tea with honey and lemon juice. Warm salt water gargles 2-3 times a day. Ludens lozenges. Increase fluids. Rest. Follow up with your PCP in 3-5 days if no better, sooner if worse or if new symptoms.  Go to the ED for severe symptoms.  Previsit planning was completed via snapshot and review of chart.  I reviewed the patient instructions on the AVS with the patient/family verbalized to me that they understood what their problem is, what they need to do about it, and why it is important that they do it.  The patient/family voices understanding of all medications. No barriers to adherence were noted. Patient is taking all medications as prescribed and is tolerating well.  Plan for follow-up as discussed or as needed if any worsening symptoms or change in condition.  After Visit Summary was given to patient/or sent to MyChart.     Newell Sonia Grew, NP   Portions of the history and exam were entered using voice recognition software.  Minor syntax, contextual, and spelling errors may be related to the  use of this software and were not intentional.  If corrections are necessary, please contact provider.   Subjective:   Sore Throat: Sonia Cruz is a 23 y.o. female who complains of a sore throat. Symptoms began a few days ago. Pain is of moderate severity. Fever is present, low grade, 100-101. Other associated symptoms have included chills and a headache.  Fluid intake is good.  There has not been contact with an individual with known strep.  Current medications include acetaminophen. No CP, SOB, or dizziness.  History reviewed. No pertinent past medical history.   No Known Allergies   Social History   Socioeconomic History  . Marital status: Unknown  Tobacco Use  . Smoking status: Unknown      Objective:   BP 124/84 (BP Location: Left Lower Arm, Patient Position: Sitting)   Pulse 87   Temp 99.4 F (37.4 C) (Oral)   Resp 20   Ht 5' 6 (1.676 m)   Wt 207 lb 6.4 oz (94.1 kg)   LMP 09/24/2023   SpO2 97%   BMI 33.48 kg/m  General appearance: alert, cooperative and no distress;  Speech clear. HEENT:  Normocephalic. EAC clear. TM's clear without effusion.  Nares patent without discharge. Mouth/Throat: Mucous membranes moist.  Uvula midline.  Posterior pharynx red, inflamed with tonsillar exudate. no PTA Neck: Anterior cervical adenopathy, supple, symmetrical, trachea midline and thyroid not enlarged, symmetric Lungs: clear to  auscultation bilaterally Heart: regular rate and rhythm, S1, S2 normal, no murmur, click, rub or gallop Skin: Skin color, texture, turgor normal. No rashes or lesions Extremities: extremities normal, atraumatic, no cyanosis or edema  Recent Results (from the past 14 hours)  POCT Flu Nucleic Acid   Collection Time: 11/28/23  4:42 PM  Result Value Ref Range   Influenza A/B Negative Negative  POCT CoVid-19 nucleic acid   Collection Time: 11/28/23  4:42 PM  Result Value Ref Range   SARS-COV-2, NAA Negative Negative  POCT Strep Nucleic Acid   Collection  Time: 11/28/23  4:47 PM  Result Value Ref Range   Strep Positive (A) Negative

## 2024-05-23 ENCOUNTER — Other Ambulatory Visit: Payer: Self-pay

## 2024-05-23 ENCOUNTER — Encounter (HOSPITAL_BASED_OUTPATIENT_CLINIC_OR_DEPARTMENT_OTHER): Payer: Self-pay | Admitting: Emergency Medicine

## 2024-05-23 ENCOUNTER — Emergency Department (HOSPITAL_BASED_OUTPATIENT_CLINIC_OR_DEPARTMENT_OTHER)

## 2024-05-23 ENCOUNTER — Emergency Department (HOSPITAL_BASED_OUTPATIENT_CLINIC_OR_DEPARTMENT_OTHER)
Admission: EM | Admit: 2024-05-23 | Discharge: 2024-05-23 | Disposition: A | Discharge status: Home / Self Care | Attending: Emergency Medicine | Admitting: Emergency Medicine

## 2024-05-23 DIAGNOSIS — R0789 Other chest pain: Secondary | ICD-10-CM | POA: Diagnosis not present

## 2024-05-23 DIAGNOSIS — G43809 Other migraine, not intractable, without status migrainosus: Secondary | ICD-10-CM

## 2024-05-23 DIAGNOSIS — H538 Other visual disturbances: Secondary | ICD-10-CM | POA: Insufficient documentation

## 2024-05-23 DIAGNOSIS — R519 Headache, unspecified: Secondary | ICD-10-CM | POA: Diagnosis present

## 2024-05-23 DIAGNOSIS — R059 Cough, unspecified: Secondary | ICD-10-CM

## 2024-05-23 LAB — CBC
HCT: 41.9 % (ref 36.0–46.0)
Hemoglobin: 13.3 g/dL (ref 12.0–15.0)
MCH: 25.8 pg — ABNORMAL LOW (ref 26.0–34.0)
MCHC: 31.7 g/dL (ref 30.0–36.0)
MCV: 81.4 fL (ref 80.0–100.0)
Platelets: 245 K/uL (ref 150–400)
RBC: 5.15 MIL/uL — ABNORMAL HIGH (ref 3.87–5.11)
RDW: 12.6 % (ref 11.5–15.5)
WBC: 8.2 K/uL (ref 4.0–10.5)
nRBC: 0 % (ref 0.0–0.2)

## 2024-05-23 LAB — BASIC METABOLIC PANEL WITH GFR
Anion gap: 13 (ref 5–15)
BUN: 9 mg/dL (ref 6–20)
CO2: 23 mmol/L (ref 22–32)
Calcium: 9.7 mg/dL (ref 8.9–10.3)
Chloride: 103 mmol/L (ref 98–111)
Creatinine, Ser: 0.81 mg/dL (ref 0.44–1.00)
GFR, Estimated: >60 mL/min (ref >60)
Glucose, Bld: 83 mg/dL (ref 70–99)
Potassium: 3.6 mmol/L (ref 3.5–5.1)
Sodium: 138 mmol/L (ref 135–145)

## 2024-05-23 LAB — TROPONIN T, HIGH SENSITIVITY: Troponin T High Sensitivity: <15 ng/L (ref 0–19)

## 2024-05-23 LAB — PREGNANCY, URINE: Preg Test, Ur: NEGATIVE

## 2024-05-23 NOTE — ED Triage Notes (Signed)
 Pt reports black spots, visual disturbance appx 0900 today. Also c/o mild headache. Took bp, 149/105, 138/103. Denies n/v.    Denies htn hx.  Reports ache when taking a deep breath. Recently had bronchitis.

## 2024-05-23 NOTE — ED Notes (Signed)
 Unsuccessful attempt to get labs x1

## 2024-05-23 NOTE — Discharge Instructions (Addendum)
 You are having a headache. This condition is very commonly seen in the Emergency Department. No specific cause was found today for your headache. It may have been a migraine or another cause of headache. Stress, anxiety, fatigue, and depression are common triggers for headaches.   Fortunately, your headache today does not appear to be life-threatening or require hospitalization at this time. This may change in the future. Sometimes headaches can appear benign (not harmful), but then more serious symptoms can develop, which should prompt an immediate re-evaluation by your doctor or the emergency department.  It is very important that you speak to your primary care doctor in the next 48 hours, to re-evaluate your symptoms, and to determine whether you need any further diagnostic testing or treatment.   SEEK MEDICAL ATTENTION IF: - you develop reaction or side effects to the medications prescribed.  - your headache is not improving within 48 hours - you have multiple episodes of vomiting or can't drink fluids - you have a change in pattern from your usual headache (eg. New location, new intensity, new symptoms, etc)  RETURN IMMEDIATELY IF you develop a sudden, severe worsening of your headache or confusion, become poorly responsive, feel like passing out, develop a fever above 100.48F, have a change in speech, vision, or swallowing, develop new weakness, numbness, tingling, or incoordination, or have a seizure.  *  You should also return if you have worsening chest pain, difficulty breathing, coughing blood, loss consciousness, or have any other emergency medical concerns.

## 2024-05-23 NOTE — ED Provider Notes (Signed)
 [facility] Provider Note   CSN: [id number] Arrival date & time: 05/23/24  1218     Patient presents with: Hypertension   Sonia Cruz is a 23 y.o. female.   Patient presents after noticing headache with blurred vision (including black spots in vision) and elevated blood pressure.  Patient also notes transient chest discomfort when taking deep breaths.  Has had sinus congestion and productive cough for the last week or so.  Denies fevers.  History of migraines (takes as needed Imitrex for this), states this headache is much less severe, did not take any OTC meds for it.  No recent medication changes.  Has been taking Xyzal since Friday for sinus congestion.   Hypertension       Prior to Admission medications   Medication Sig Start Date End Date Taking? Authorizing Provider  cyclobenzaprine  (FLEXERIL ) 5 MG tablet Take 1 tablet (5 mg total) by mouth 3 (three) times daily as needed for muscle spasms. 08/15/23   Christopher Savannah, PA-C  montelukast  (SINGULAIR ) 10 MG tablet Take 1 tablet (10 mg total) by mouth at bedtime. Patient not taking: Reported on 01/04/2021 12/23/20   Luke Orlan HERO, DO  naproxen  (NAPROSYN ) 500 MG tablet Take 1 tablet (500 mg total) by mouth 2 (two) times daily with a meal. 08/15/23   Christopher Savannah, PA-C  SUMAtriptan (IMITREX) 100 MG tablet Take 100 mg by mouth once as needed for migraine. May repeat in 2 hours if headache persists or recurs.    [provider]  topiramate (TOPAMAX) 50 MG tablet Take 50 mg by mouth daily.    [provider]    Allergies: Pork-derived products    Review of Systems  Updated Vital Signs BP (!) 141/101 (BP Location: Right Arm)   Pulse 80   Temp 98.8 F (37.1 C) (Oral)   Resp 14   Ht 5' 6 (1.676 m)   Wt 91.2 kg   SpO2 100%   BMI 32.44 kg/m   Physical Exam Vitals and nursing note reviewed.  Constitutional:      General: She is not in acute distress.    Appearance:  Normal appearance.  HENT:     Head: Normocephalic and atraumatic.     Nose: No rhinorrhea.     Mouth/Throat:     Mouth: Mucous membranes are moist.  Eyes:     Extraocular Movements: Extraocular movements intact.     Conjunctiva/sclera: Conjunctivae normal.     Pupils: Pupils are equal, round, and reactive to light.  Cardiovascular:     Rate and Rhythm: Normal rate and regular rhythm.     Heart sounds: Normal heart sounds.  Pulmonary:     Effort: Pulmonary effort is normal.     Breath sounds: Normal breath sounds.  Abdominal:     General: Abdomen is flat. Bowel sounds are normal.     Palpations: Abdomen is soft.     Tenderness: There is no abdominal tenderness.  Musculoskeletal:        General: Normal range of motion.  Skin:    General: Skin is warm and dry.  Neurological:     General: No focal deficit present.     Mental Status: She is alert.     Cranial Nerves: No cranial nerve deficit.     Sensory: No sensory deficit.     Motor: No weakness.  Psychiatric:        Mood and Affect: Mood normal.  Behavior: Behavior normal.     (all labs ordered are listed, but only abnormal results are displayed) Labs Reviewed  BASIC METABOLIC PANEL WITH GFR  CBC  PREGNANCY, URINE  TROPONIN T, HIGH SENSITIVITY    EKG: None  Radiology: DG Chest 2 View Result Date: 05/23/2024 CLINICAL DATA:  Chest pain and hypertension EXAM: DG CHEST 2V COMPARISON:  January 02, 2022 FINDINGS: No focal consolidation. No pleural effusions or pneumothorax. Unchanged cardiomediastinal silhouette. No acute osseous findings. IMPRESSION: No active cardiopulmonary disease. Electronically Signed   By: Michaeline Blanch M.D.   On: 05/23/2024 13:36     Procedures   Medications Ordered in the ED - No data to display                                  Medical Decision Making 23 year old female with history of migraines who presents with vision changes, headache, and elevated blood pressure that started this  morning.  Differential includes cerebrovascular process (less likely given age and benign neurological exam), migraine, or other headache.  Patient also has ongoing respiratory symptoms with congestion, cough, and chest discomfort with deep breath.  This may represent lingering symptoms from recent bronchitis vs other URI.  Patient states her home COVID test was negative. EKG shows NSR and chest x-ray without acute abnormality.  BMP, CBC, troponin pending.  Care assumed by Dr. Emil.  Amount and/or Complexity of Data Reviewed Labs: ordered. Radiology: ordered.       Final diagnoses:  None    ED Discharge Orders     None          Adele Song, MD 05/23/24 1445    Emil Share, DO 05/23/24 1459

## 2024-05-23 NOTE — ED Provider Notes (Signed)
 23 yo female here with cough, headache with visual aura Had some transient chest pain with cough earlier this week Labs reassuring, pending troponin  Physical Exam  BP (!) 133/90   Pulse 69   Temp 98.8 F (37.1 C) (Oral)   Resp 18   Ht 5' 6 (1.676 m)   Wt 91.2 kg   SpO2 100%   BMI 32.44 kg/m   Physical Exam  Procedures  Procedures  ED Course / MDM    Medical Decision Making Amount and/or Complexity of Data Reviewed Labs: ordered. Radiology: ordered.   Patient reassessed after workup completed.  There were no emergent findings in her workup.  Suspect she likely had a viral syndrome with her coughing.  Low suspicion for myocarditis, pericarditis, pneumonia, PE, or other life-threatening condition as a cause of her chest pain.  Doubt ACS.  Patient reports she has no headache at this time.  She does have Imitrex at home.  She is cleared for discharge.  Suspect she may have experienced migraine with visual aura.       Cottie Donnice PARAS, MD 05/23/24 [phone]

## 2024-05-25 ENCOUNTER — Ambulatory Visit (INDEPENDENT_AMBULATORY_CARE_PROVIDER_SITE_OTHER): Admitting: Neurology

## 2024-05-25 ENCOUNTER — Encounter: Payer: Self-pay | Admitting: Neurology

## 2024-05-25 VITALS — BP 118/80 | HR 73 | Ht 66.0 in | Wt 207.5 lb

## 2024-05-25 DIAGNOSIS — G44209 Tension-type headache, unspecified, not intractable: Secondary | ICD-10-CM | POA: Diagnosis not present

## 2024-05-25 DIAGNOSIS — G43909 Migraine, unspecified, not intractable, without status migrainosus: Secondary | ICD-10-CM | POA: Diagnosis not present

## 2024-05-25 NOTE — Progress Notes (Signed)
 [facility]  PATIENT: Sonia Cruz DOB: 2000/10/03  REQUESTING CLINICIAN: Emil Share, DO HISTORY FROM: Patient  REASON FOR VISIT: Migraines    HISTORICAL  CHIEF COMPLAINT:  Chief Complaint  Patient presents with   New Patient (Initial Visit)    Pt in room 12.alone. Internal referral for headaches.    HISTORY OF PRESENT ILLNESS:  This is a 23 year old woman past medical history of migraine headache diagnosed since the age of 25, who is presenting for management of her headaches.  She tells me the headache was diagnosed at the age of 39, initially she was treated with topiramate, sumatriptan.  She did well, has not had a headache for many years until this summer when the headache restarted.  She tells me she had a bad migraine in June and 2 days ago she did have another headache that landed to her to the ED.  She tells me that this headache was different from her typical migraine, did not have any sensitivity to light, no migrainous feature and the location of the headache was different. This was at the top of the head, where her migraine used to be in the temporal region.  The quality was also different.  During this time also she checked her blood pressure and it was elevated 138/104 and check it again 141/101.       Headache History and Characteristics: Onset: Since the age of 37 Location: Middle of head then spread Quality:  pressure, stabbing pressure  Intensity: 10/10.  Duration: Can last the entire day  Migrainous Features: Photophobia, phonophobia, nausea, vomiting.  Aura: No  History of brain injury or tumor: No  Family history: Sister   Motion sickness: no Cardiac history: no  OTC: tylenol, codeine Sleep: Good  Mood/ Stress:    Prior prophylaxis: Propranolol: No  Verapamil:No TCA: No Topamax: Yes Depakote: No Effexor: No Cymbalta: No Neurontin:No  Prior abortives: Triptan: Yes  Anti-emetic: No Steroids: No Ergotamine suppository:  No    OTHER MEDICAL CONDITIONS: Migraines    REVIEW OF SYSTEMS: Full 14 system review of systems performed and negative with exception of: As noted in the HPI   ALLERGIES: Allergies  Allergen Reactions   Pork-Derived Products Hives    HOME MEDICATIONS: Outpatient Medications Prior to Visit  Medication Sig Dispense Refill   SUMAtriptan (IMITREX) 100 MG tablet Take 100 mg by mouth once as needed for migraine. May repeat in 2 hours if headache persists or recurs.     cyclobenzaprine  (FLEXERIL ) 5 MG tablet Take 1 tablet (5 mg total) by mouth 3 (three) times daily as needed for muscle spasms. (Patient not taking: Reported on 05/25/2024) 30 tablet 0   montelukast  (SINGULAIR ) 10 MG tablet Take 1 tablet (10 mg total) by mouth at bedtime. (Patient not taking: Reported on 05/25/2024) 30 tablet 5   naproxen  (NAPROSYN ) 500 MG tablet Take 1 tablet (500 mg total) by mouth 2 (two) times daily with a meal. (Patient not taking: Reported on 05/25/2024) 30 tablet 0   topiramate (TOPAMAX) 50 MG tablet Take 50 mg by mouth daily. (Patient not taking: Reported on 05/25/2024)     No facility-administered medications prior to visit.    PAST MEDICAL HISTORY: Past Medical History:  Diagnosis Date   Angio-edema    Migraines    Seasonal allergies    Urticaria     PAST SURGICAL HISTORY: Past Surgical History:  Procedure Laterality Date   ADENOIDECTOMY     TYMPANOSTOMY TUBE PLACEMENT  FAMILY HISTORY: Family History  Problem Relation Age of Onset   Allergic rhinitis Mother    Allergic rhinitis Father    Allergic rhinitis Sister    Urticaria Sister    Asthma Brother    Allergic rhinitis Brother    Allergic rhinitis Maternal Grandmother    Urticaria Maternal Grandmother    Eczema Neg Hx     SOCIAL HISTORY: Social History   Socioeconomic History   Marital status: Single    Spouse name: Not on file   Number of children: Not on file   Years of education: Not on file   Highest education  level: Not on file  Occupational History   Not on file  Tobacco Use   Smoking status: Never   Smokeless tobacco: Never  Vaping Use   Vaping status: Former   Quit date: 11/06/2023  Substance and Sexual Activity   Alcohol use: Yes    Comment: occ   Drug use: Never   Sexual activity: Yes    Birth control/protection: Implant  Other Topics Concern   Not on file  Social History Narrative   Not on file   Social Drivers of Health   Financial Resource Strain: Not on file  Food Insecurity: Not on file  Transportation Needs: Not on file  Physical Activity: Not on file  Stress: Not on file  Social Connections: Not on file  Intimate Partner Violence: Not on file    PHYSICAL EXAM  GENERAL EXAM/CONSTITUTIONAL: Vitals:  Vitals:   05/25/24 0843  BP: 118/80  Pulse: 73  Weight: 207 lb 8 oz (94.1 kg)  Height: 5' 6 (1.676 m)   Body mass index is 33.49 kg/m. Wt Readings from Last 3 Encounters:  05/25/24 207 lb 8 oz (94.1 kg)  05/23/24 201 lb (91.2 kg)  09/21/23 182 lb 1.6 oz (82.6 kg)   Patient is in no distress; well developed, nourished and groomed; neck is supple  MUSCULOSKELETAL: Gait, strength, tone, movements noted in Neurologic exam below  NEUROLOGIC: MENTAL STATUS:      No data to display         awake, alert, oriented to person, place and time recent and remote memory intact normal attention and concentration language fluent, comprehension intact, naming intact fund of knowledge appropriate  CRANIAL NERVE:  2nd - no papilledema or hemorrhages on fundoscopic exam 2nd, 3rd, 4th, 6th - pupils equal and reactive to light, visual fields full to confrontation, extraocular muscles intact, no nystagmus 5th - facial sensation symmetric 7th - facial strength symmetric 8th - hearing intact 9th - palate elevates symmetrically, uvula midline 11th - shoulder shrug symmetric 12th - tongue protrusion midline  MOTOR:  normal bulk and tone, full strength in the BUE,  BLE  SENSORY:  normal and symmetric to light touch, pinprick  COORDINATION:  finger-nose-finger, fine finger movements normal  GAIT/STATION:  normal    DIAGNOSTIC DATA (LABS, IMAGING, TESTING) - I reviewed patient records, labs, notes, testing and imaging myself where available.  Lab Results  Component Value Date   WBC 8.2 05/23/2024   HGB 13.3 05/23/2024   HCT 41.9 05/23/2024   MCV 81.4 05/23/2024   PLT 245 05/23/2024      Component Value Date/Time   NA 138 05/23/2024 1234   NA 138 12/23/2020 1219   K 3.6 05/23/2024 1234   CL 103 05/23/2024 1234   CO2 23 05/23/2024 1234   GLUCOSE 83 05/23/2024 1234   BUN 9 05/23/2024 1234   BUN 10 12/23/2020 1219  CREATININE 0.81 05/23/2024 1234   CALCIUM 9.7 05/23/2024 1234   PROT 7.9 01/04/2021 0557   PROT 8.1 12/23/2020 1219   ALBUMIN 4.1 01/04/2021 0557   ALBUMIN 4.4 12/23/2020 1219   AST 21 01/04/2021 0557   ALT 14 01/04/2021 0557   ALKPHOS 62 01/04/2021 0557   BILITOT 0.4 01/04/2021 0557   BILITOT <0.2 12/23/2020 1219   GFRNONAA >60 05/23/2024 1234   No results found for: CHOL, HDL, LDLCALC, LDLDIRECT, TRIG, CHOLHDL No results found for: YHAJ8R No results found for: VITAMINB12 Lab Results  Component Value Date   TSH 1.320 12/23/2020     ASSESSMENT AND PLAN  23 y.o. year old female with history of migraine headaches who presenting for evaluation of her migraine.  Her migraines are actually well-controlled, her last migraine was in June 2025.  She does take Imitrex as abortive medication with good control.  Tells me the Imitrex makes her sleepy.  For the headache that she had 2 days ago, this was most likely tension type headache related to her elevated blood pressure.  I did advise her to check her blood pressure at home for the next 2 weeks and to send us  the report.  She may need to be started on a low-dose antihypertensive.   For her migraines, she will continue Imitrex as needed, I will also give  her a sample of Nurtec and Ubrelvy to try.  I will see her in 6 months for follow-up or sooner if worse.   1. Episodic migraine   2. Tension headache     Patient Instructions  Continue with Imitrex as needed  Will give patient a sample of Nurtec and Ubrelvy Please monitor your blood pressure for the next couple weeks  Return in 6 to 8 months or sooner if worse    No orders of the defined types were placed in this encounter.   No orders of the defined types were placed in this encounter.   Return in about 6 months (around 11/22/2024).    Pastor Falling, MD 05/25/2024, 11:11 AM  [facility] [address] [place], [place] [address] [phone]

## 2024-05-25 NOTE — Patient Instructions (Signed)
 Continue with Imitrex as needed  Will give patient a sample of Nurtec and Ubrelvy Please monitor your blood pressure for the next couple weeks  Return in 6 to 8 months or sooner if worse

## 2024-08-22 ENCOUNTER — Other Ambulatory Visit: Payer: Self-pay

## 2024-08-22 ENCOUNTER — Ambulatory Visit: Admitting: Podiatry

## 2024-08-22 ENCOUNTER — Encounter: Payer: Self-pay | Admitting: Allergy

## 2024-08-22 ENCOUNTER — Ambulatory Visit: Admitting: Allergy

## 2024-08-22 ENCOUNTER — Encounter: Payer: Self-pay | Admitting: Podiatry

## 2024-08-22 VITALS — BP 116/80 | HR 106 | Temp 97.8°F | Resp 18 | Ht 66.0 in | Wt 212.8 lb

## 2024-08-22 DIAGNOSIS — T7819XA Other adverse food reactions, not elsewhere classified, initial encounter: Secondary | ICD-10-CM | POA: Diagnosis not present

## 2024-08-22 DIAGNOSIS — L509 Urticaria, unspecified: Secondary | ICD-10-CM

## 2024-08-22 DIAGNOSIS — L503 Dermatographic urticaria: Secondary | ICD-10-CM | POA: Diagnosis not present

## 2024-08-22 DIAGNOSIS — B351 Tinea unguium: Secondary | ICD-10-CM | POA: Diagnosis not present

## 2024-08-22 DIAGNOSIS — J3089 Other allergic rhinitis: Secondary | ICD-10-CM

## 2024-08-22 MED ORDER — FAMOTIDINE 20 MG PO TABS: 20.0000 mg | ORAL_TABLET | Freq: Two times a day (BID) | ORAL | 2 refills | Status: DC

## 2024-08-22 NOTE — Progress Notes (Signed)
°  Subjective:  Patient ID: Sonia Cruz, female    DOB: 01/03/2001,   MRN: [id number]  Chief Complaint  Patient presents with   Nail Problem    Both of my big toenails have fallen off before.  When they grew back, I think they grew back ingrown.  I'm here to have that checked out. (Lateral borders)    23 y.o. female presents for concern of both great toenails.  She relates they have fallen off in the past before.  And when they grew back and they grew back in with some extra skin and nails.  She relates currently it is not tender but every now and then it does cause some tenderness.  She is mostly concerned with the appearance.. Denies any other pedal complaints. Denies n/v/f/c.   Past Medical History:  Diagnosis Date   Angio-edema    Migraines    Seasonal allergies    Urticaria     Objective:  Physical Exam: Vascular: DP/PT pulses 2/4 bilateral. CFT <3 seconds. Normal hair growth on digits. No edema.  Skin. No lacerations or abrasions bilateral feet.  Bilateral hallux nails thickened dystrophic and with subungual debris.  Covered in polish today.  Lateral borders with hyperkeratotic tissue present. Musculoskeletal: MMT 5/5 bilateral lower extremities in DF, PF, Inversion and Eversion. Deceased ROM in DF of ankle joint.  Neurological: Sensation intact to light touch.   Assessment:   1. Onychomycosis      Plan:  Patient was evaluated and treated and all questions answered. -Examined patient -Discussed treatment options for painful dystrophic nails  -Clinical picture and Fungal culture was obtained by removing a portion of the hard nail itself from each of the involved toenails using a sterile nail nipper and sent to [facility] lab. Patient tolerated the biopsy procedure well without discomfort or need for anesthesia.  -Discussed fungal nail treatment options including oral, topical, and laser treatments.  -Patient to return in 4 weeks for follow up evaluation and discussion of  fungal culture results or sooner if symptoms worsen.   Asberry Failing, DPM

## 2024-08-22 NOTE — Progress Notes (Signed)
 New Patient Note  RE: Sonia Cruz MRN: [id number] DOB: [birth year] Date of Office Visit: 08/22/2024  Consult requested by: No ref. provider found Primary care provider: Pcp, No  Chief Complaint: Establish Care (Pt is aware of the allergies to pork and most things and which she has avoided. She feels that there is something new. Patient states for the past 2 days, hive flare.)  History of Present Illness: I had the pleasure of seeing Sonia Cruz for initial evaluation at the [facility] on 08/22/2024. She is a 23 y.o. female, who is referred here by No ref. provider found for the evaluation of hives.  Last seen in our office in April 2022 for hives. Failed to follow up as recommended.   Discussed the use of AI scribe software for clinical note transcription with the patient, who gave verbal consent to proceed.    She has experienced hives for the past three years. Initially, she was unable to undergo a skin test due to high histamine levels, but she recalls being told that her blood work showed possible allergies to pollen, dust mites, animals, and a borderline intolerance to pork. Despite eliminating pork from her diet, she continues to experience hives approximately once or twice a month.  Recently, she had a significant flare-up of hives starting Sunday night, with intense itching on her back and legs, and welts forming upon scratching. By Monday, hives appeared on her legs. The itching is severe, affecting her face, although she refrains from scratching it.  She takes Xyzal daily for seasonal allergies and uses Allegra as needed when hives appear. During the recent outbreak, she took Allegra at 1 AM on Sunday night and again on Monday. Allegra causes drowsiness despite being labeled as non-drowsy. She has not taken any steroids or nasal sprays recently and has not started Singulair.  No recent illness, changes in medication, or new exposures. 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 No recent fever, chills, or respiratory symptoms.  She is about to graduate college, which she acknowledges as a potential source of stress.      20 22 labs: Blood work was positive to dust mites, grass pollen, cockroach.  Borderline positive to tree pollen and ragweed pollen. Start environmental control measures - please mail to her home.   Blood count, kidney function, liver function, electrolytes, thyroid, autoimmune screener, chronic urticaria index (checks for autoantibodies that trigger mast cells), tryptase (checks for mast cell issues) and alpha gal (checks for red meat allergy) were all normal which is great.   Borderline positive to pork - does she eat pork right now? Any symptoms?   Assessment and Plan: Sonia Cruz is a 23 y.o. female with:    Chronic urticaria and dermatographism Improved over the past few years but still was breaking out 1-2 times per month with no triggers. Avoidance of pork didn't make a difference. Current treatment with Allegra and Xyzal is inadequate and having worsening pruritus and hives.  Based on clinical history, she likely has chronic idiopathic urticaria. Discussed with patient, that urticaria is usually caused by release of histamine by cutaneous mast cells but sometimes it is non-histamine mediated. Explained that urticaria is not always associated with allergies. In most cases, the exact etiology for urticaria can not be established and it is considered idiopathic. Discussed the role of adding omalizumab  (anti-IgE antibodies) in controlling urticaria in refractory patients. http://www.chang-murphy.com/.html Start allegra (fexofenadine)  180mg  twice a day. If symptoms are not controlled or causes drowsiness let us  know. Start Pepcid  (famotidine ) 20mg  twice a day.  Avoid the following potential triggers: alcohol, tight clothing, NSAIDs, hot  showers and getting overheated. See below for proper skin care.  Get bloodwork to repeat pork level. I don't think this is causing your hives as you were still breaking out even with avoidance.   Environmental allergies 2022 labs positive to dust mites, grass pollen, cockroach.  Borderline positive to tree pollen and ragweed pollen. See below for environmental control measures.  Nasal saline spray (i.e., Simply Saline) or nasal saline lavage (i.e., NeilMed) is recommended as needed and prior to medicated nasal sprays. Consider allergy injections for long term control if above medications do not help the symptoms - handout given.   Return in about 4 weeks (around 09/19/2024).  Meds ordered this encounter  Medications   famotidine  (PEPCID ) 20 MG tablet    Sig: Take 1 tablet (20 mg total) by mouth 2 (two) times daily.    Dispense:  60 tablet    Refill:  2   Lab Orders         Alpha-Gal Panel      Other allergy screening: Asthma: no Rhino conjunctivitis: yes Medication allergy: no Hymenoptera allergy: no History of recurrent infections suggestive of immunodeficency: no  Diagnostics: None.   Past Medical History: Patient Active Problem List   Diagnosis Date Noted   Urticaria 11/19/2020   Other allergic rhinitis 11/19/2020   Dermatographism 11/19/2020   Past Medical History:  Diagnosis Date   Angio-edema    Migraines    Seasonal allergies    Urticaria    Past Surgical History: Past Surgical History:  Procedure Laterality Date   ADENOIDECTOMY     TYMPANOSTOMY TUBE PLACEMENT     Medication List:  Current Outpatient Medications  Medication Sig Dispense Refill   etonogestrel (NEXPLANON) 68 MG IMPL implant Inject 1 each into the skin.     famotidine  (PEPCID ) 20 MG tablet Take 1 tablet (20 mg total) by mouth 2 (two) times daily. 60 tablet 2   SUMAtriptan (IMITREX) 100 MG tablet Take 100 mg by mouth once as needed for migraine. May repeat in 2 hours if headache persists  or recurs.     No current facility-administered medications for this visit.   Allergies: Allergies[1] Social History: Social History   Socioeconomic History   Marital status: Single    Spouse name: Not on file   Number of children: Not on file   Years of education: Not on file   Highest education level: Not on file  Occupational History   Not on file  Tobacco Use   Smoking status: Never   Smokeless tobacco: Never  Vaping Use   Vaping status: Former   Quit date: 11/06/2023  Substance and Sexual Activity   Alcohol use: Yes    Comment: occ   Drug use: Never   Sexual activity: Yes    Birth control/protection: Implant  Other Topics Concern   Not on file  Social History Narrative   Not on file   Social Drivers of Health   Tobacco Use: Low Risk (08/22/2024)   Patient History    Smoking Tobacco Use: Never    Smokeless Tobacco Use: Never    Passive Exposure: Not on file  Financial Resource Strain: Not on file  Food Insecurity: Not on file  Transportation Needs: Not on file  Physical Activity: Not on file  Stress: Not on  file  Social Connections: Not on file  Depression (PHQ2-9): Not on file  Alcohol Screen: Not on file  Housing: Not on file  Utilities: Not on file  Health Literacy: Not on file   Lives in an apartment. Smoking: vaped from 2022 to 2025 Occupation: building surveyor  Environmental History: Water Damage/mildew in the house: no Engineer, Civil (consulting) in the family room: no Carpet in the bedroom: yes Heating: electric Cooling: central Pet: no  Family History: Family History  Problem Relation Age of Onset   Allergic rhinitis Mother    Allergic rhinitis Father    Allergic rhinitis Sister    Urticaria Sister    Asthma Brother    Allergic rhinitis Brother    Allergic rhinitis Maternal Grandmother    Urticaria Maternal Grandmother    Eczema Neg Hx    Review of Systems  Constitutional:  Negative for appetite change, chills, fever and unexpected weight change.   HENT:  Negative for congestion and rhinorrhea.   Eyes:  Negative for itching.  Respiratory:  Negative for cough, chest tightness, shortness of breath and wheezing.   Cardiovascular:  Negative for chest pain.  Gastrointestinal:  Negative for abdominal pain.  Genitourinary:  Negative for difficulty urinating.  Skin:  Positive for rash.  Allergic/Immunologic: Positive for environmental allergies.  Neurological:  Negative for headaches.    Objective: BP 116/80 (BP Location: Left Arm, Patient Position: Sitting, Cuff Size: Large)   Pulse (!) 106   Temp 97.8 F (36.6 C) (Temporal)   Resp 18   Ht 5' 6 (1.676 m)   Wt 212 lb 12.8 oz (96.5 kg)   SpO2 99%   BMI 34.35 kg/m  Body mass index is 34.35 kg/m. Physical Exam Vitals and nursing note reviewed.  Constitutional:      Appearance: Normal appearance. She is well-developed.  HENT:     Head: Normocephalic and atraumatic.     Right Ear: Tympanic membrane and external ear normal.     Left Ear: Tympanic membrane and external ear normal.     Nose: Nose normal.     Mouth/Throat:     Mouth: Mucous membranes are moist.     Pharynx: Oropharynx is clear.  Eyes:     Conjunctiva/sclera: Conjunctivae normal.  Cardiovascular:     Rate and Rhythm: Normal rate and regular rhythm.     Heart sounds: Normal heart sounds. No murmur heard.    No friction rub. No gallop.  Pulmonary:     Effort: Pulmonary effort is normal.     Breath sounds: Normal breath sounds. No wheezing, rhonchi or rales.  Musculoskeletal:     Cervical back: Neck supple.  Skin:    General: Skin is warm.     Findings: No rash.  Neurological:     Mental Status: She is alert and oriented to person, place, and time.  Psychiatric:        Behavior: Behavior normal.    The plan was reviewed with the patient/family, and all questions/concerned were addressed.  It was my pleasure to see Sonia Cruz today and participate in her care. Please feel free to contact me with any questions  or concerns.  Sincerely,  Orlan Cramp, DO Allergy & Immunology  [facility]  [facility] office: [phone] [facility] office: [phone]      [1]  Allergies Allergen Reactions   Porcine (Pork) Protein-Containing Drug Products Hives

## 2024-08-22 NOTE — Addendum Note (Signed)
 Addended by: WAYLAN ELIDIA PARAS on: 08/22/2024 09:53 AM   Modules accepted: Orders

## 2024-08-22 NOTE — Patient Instructions (Addendum)
 Hives: Based on clinical history, she likely has chronic idiopathic urticaria. Discussed with patient, that urticaria is usually caused by release of histamine by cutaneous mast cells but sometimes it is non-histamine mediated. Explained that urticaria is not always associated with allergies. In most cases, the exact etiology for urticaria can not be established and it is considered idiopathic. Discussed the role of adding omalizumab  (anti-IgE antibodies) in controlling urticaria in refractory patients. http://www.chang-murphy.com/.html  Start allegra (fexofenadine) 180mg  twice a day. If symptoms are not controlled or causes drowsiness let us  know. Start Pepcid  (famotidine ) 20mg  twice a day.  Avoid the following potential triggers: alcohol, tight clothing, NSAIDs, hot showers and getting overheated. See below for proper skin care.   Get bloodwork to repeat pork level. I don't think this is causing your hives as you were still breaking out even with avoidance.  We are ordering labs, so please allow 1-2 weeks for the results to come back. With the newly implemented Cures Act, the labs might be visible to you at the same time that they become visible to me. However, I will not address the results until all of the results are back, so please be patient.   Environmental allergies 2022 labs positive to dust mites, grass pollen, cockroach.  Borderline positive to tree pollen and ragweed pollen. See below for environmental control measures.  Nasal saline spray (i.e., Simply Saline) or nasal saline lavage (i.e., NeilMed) is recommended as needed and prior to medicated nasal sprays. Consider allergy injections for long term control if above medications do not help the symptoms - handout given.   Return in about 4 weeks (around 09/19/2024). Or sooner if needed.   Control of House Dust Mite Allergen Dust mite allergens are a common trigger of allergy and asthma symptoms. While  they can be found throughout the house, these microscopic creatures thrive in warm, humid environments such as bedding, upholstered furniture and carpeting. Because so much time is spent in the bedroom, it is essential to reduce mite levels there.  Encase pillows, mattresses, and box springs in special allergen-proof fabric covers or airtight, zippered plastic covers.  Bedding should be washed weekly in hot water (130 F) and dried in a hot dryer. Allergen-proof covers are available for comforters and pillows that cant be regularly washed.  Wash the allergy-proof covers every few months. Minimize clutter in the bedroom. Keep pets out of the bedroom.  Keep humidity less than 50% by using a dehumidifier or air conditioning. You can buy a humidity measuring device called a hygrometer to monitor this.  If possible, replace carpets with hardwood, linoleum, or washable area rugs. If that's not possible, vacuum frequently with a vacuum that has a HEPA filter. Remove all upholstered furniture and non-washable window drapes from the bedroom. Remove all non-washable stuffed toys from the bedroom.  Wash stuffed toys weekly.  Cockroach Allergen Avoidance Cockroaches are often found in the homes of densely populated urban areas, schools or commercial buildings, but these creatures can lurk almost anywhere. This does not mean that you have a dirty house or living area. Block all areas where roaches can enter the home. This includes crevices, wall cracks and windows.  Cockroaches need water to survive, so fix and seal all leaky faucets and pipes. Have an exterminator go through the house when your family and pets are gone to eliminate any remaining roaches. Keep food in lidded containers and put pet food dishes away after your pets are done eating. Vacuum and sweep the  floor after meals, and take out garbage and recyclables. Use lidded garbage containers in the kitchen. Wash dishes immediately after use and clean  under stoves, refrigerators or toasters where crumbs can accumulate. Wipe off the stove and other kitchen surfaces and cupboards regularly.  Reducing Pollen Exposure Pollen seasons: trees (spring), grass (summer) and ragweed/weeds (fall). Keep windows closed in your home and car to lower pollen exposure.  Install air conditioning in the bedroom and throughout the house if possible.  Avoid going out in dry windy days - especially early morning. Pollen counts are highest between 5 - 10 AM and on dry, hot and windy days.  Save outside activities for late afternoon or after a heavy rain, when pollen levels are lower.  Avoid mowing of grass if you have grass pollen allergy. Be aware that pollen can also be transported indoors on people and pets.  Dry your clothes in an automatic dryer rather than hanging them outside where they might collect pollen.  Rinse hair and eyes before bedtime.   Skin care recommendations  Bath time: Always use lukewarm water. AVOID very hot or cold water. Keep bathing time to 5-10 minutes. Do NOT use bubble bath. Use a mild soap and use just enough to wash the dirty areas. Do NOT scrub skin vigorously.  After bathing, pat dry your skin with a towel. Do NOT rub or scrub the skin.  Moisturizers and prescriptions:  ALWAYS apply moisturizers immediately after bathing (within 3 minutes). This helps to lock-in moisture. Use the moisturizer several times a day over the whole body. Good summer moisturizers include: Aveeno, CeraVe, Cetaphil. Good winter moisturizers include: Aquaphor, Vaseline, Cerave, Cetaphil, Eucerin, Vanicream. When using moisturizers along with medications, the moisturizer should be applied about one hour after applying the medication to prevent diluting effect of the medication or moisturize around where you applied the medications. When not using medications, the moisturizer can be continued twice daily as maintenance.  Laundry and clothing: Avoid  laundry products with added color or perfumes. Use unscented hypo-allergenic laundry products such as Tide free, Cheer free & gentle, and All free and clear.  If the skin still seems dry or sensitive, you can try double-rinsing the clothes. Avoid tight or scratchy clothing such as wool. Do not use fabric softeners or dyer sheets.

## 2024-08-25 LAB — ALPHA-GAL PANEL
Allergen Lamb IgE: <0.1 kU/L
Beef IgE: 0.64 kU/L — AB
IgE (Immunoglobulin E), Serum: 1059 [IU]/mL — ABNORMAL HIGH (ref 6–495)
O215-IgE Alpha-Gal: <0.1 kU/L
Pork IgE: 1.02 kU/L — AB

## 2024-08-28 ENCOUNTER — Ambulatory Visit: Payer: Self-pay | Admitting: Allergy

## 2024-08-28 ENCOUNTER — Other Ambulatory Visit: Payer: Self-pay | Admitting: Podiatry

## 2024-09-18 NOTE — Progress Notes (Unsigned)
 "  Follow Up Note  RE: Sonia Cruz MRN: [id number] DOB: Nov 07, 2000 Date of Office Visit: 09/19/2024  Referring provider: No ref. provider found Primary care provider: Pcp, No  Chief Complaint: No chief complaint on file.  History of Present Illness: I had the pleasure of seeing Sonia Cruz for a follow up visit at the [facility] on 09/19/2024. She is a 24 y.o. female, who is being followed for chronic urticaria with dermatographism and environmental allergies. Her previous allergy office visit was on 08/22/2024 with Dr. Luke. Today is a regular follow up visit.  Discussed the use of AI scribe software for clinical note transcription with the patient, who gave verbal consent to proceed.  History of Present Illness            2025 labs: Alpha gal was negative BUT your pork IgE and beef IgE was elevated. Please start to completely avoid pork and beef products. Follow up in January as scheduled. Depending how you are doing at that visit, we can discuss reintroduction at that time.  Assessment and Plan: Sonia Cruz is a 24 y.o. female with: Chronic urticaria and dermatographism Improved over the past few years but still was breaking out 1-2 times per month with no triggers. Avoidance of pork didn't make a difference. Current treatment with Allegra and Xyzal is inadequate and having worsening pruritus and hives.  Based on clinical history, she likely has chronic idiopathic urticaria. Discussed with patient, that urticaria is usually caused by release of histamine by cutaneous mast cells but sometimes it is non-histamine mediated. Explained that urticaria is not always associated with allergies. In most cases, the exact etiology for urticaria can not be established and it is considered idiopathic. Discussed the role of adding omalizumab  (anti-IgE antibodies) in controlling urticaria in refractory patients. http://www.chang-murphy.com/.html Start  allegra (fexofenadine) 180mg  twice a day. If symptoms are not controlled or causes drowsiness let us  know. Start Pepcid  (famotidine ) 20mg  twice a day.  Avoid the following potential triggers: alcohol, tight clothing, NSAIDs, hot showers and getting overheated. See below for proper skin care.  Get bloodwork to repeat pork level. I don't think this is causing your hives as you were still breaking out even with avoidance.    Environmental allergies 2022 labs positive to dust mites, grass pollen, cockroach.  Borderline positive to tree pollen and ragweed pollen. See below for environmental control measures.  Nasal saline spray (i.e., Simply Saline) or nasal saline lavage (i.e., NeilMed) is recommended as needed and prior to medicated nasal sprays. Consider allergy injections for long term control if above medications do not help the symptoms - handout given.  Assessment and Plan              No follow-ups on file.  No orders of the defined types were placed in this encounter.  Lab Orders  No laboratory test(s) ordered today    Diagnostics: Spirometry:  Tracings reviewed. Her effort: {Blank single:19197::Good reproducible efforts.,It was hard to get consistent efforts and there is a question as to whether this reflects a maximal maneuver.,Poor effort, data can not be interpreted.} FVC: ***L FEV1: ***L, ***% predicted FEV1/FVC ratio: ***% Interpretation: {Blank single:19197::Spirometry consistent with mild obstructive disease,Spirometry consistent with moderate obstructive disease,Spirometry consistent with severe obstructive disease,Spirometry consistent with possible restrictive disease,Spirometry consistent with mixed obstructive and restrictive disease,Spirometry uninterpretable due to technique,Spirometry consistent with normal pattern,No overt abnormalities noted given today's efforts}.  Please see scanned spirometry results for details.  Skin Testing:  {  Blank single:19197::Select foods,Environmental allergy panel,Environmental allergy panel and select foods,Food allergy panel,None,Deferred due to recent antihistamines use}. *** Results discussed with patient/family.   Medication List:  Current Outpatient Medications  Medication Sig Dispense Refill   etonogestrel (NEXPLANON) 68 MG IMPL implant Inject 1 each into the skin.     famotidine  (PEPCID ) 20 MG tablet Take 1 tablet (20 mg total) by mouth 2 (two) times daily. 60 tablet 2   SUMAtriptan (IMITREX) 100 MG tablet Take 100 mg by mouth once as needed for migraine. May repeat in 2 hours if headache persists or recurs.     No current facility-administered medications for this visit.   Allergies: Allergies[1] I reviewed her past medical history, social history, family history, and environmental history and no significant changes have been reported from her previous visit.  Review of Systems  Constitutional:  Negative for appetite change, chills, fever and unexpected weight change.  HENT:  Negative for congestion and rhinorrhea.   Eyes:  Negative for itching.  Respiratory:  Negative for cough, chest tightness, shortness of breath and wheezing.   Cardiovascular:  Negative for chest pain.  Gastrointestinal:  Negative for abdominal pain.  Genitourinary:  Negative for difficulty urinating.  Skin:  Positive for rash.  Allergic/Immunologic: Positive for environmental allergies.  Neurological:  Negative for headaches.    Objective: There were no vitals taken for this visit. There is no height or weight on file to calculate BMI. Physical Exam Vitals and nursing note reviewed.  Constitutional:      Appearance: Normal appearance. She is well-developed.  HENT:     Head: Normocephalic and atraumatic.     Right Ear: Tympanic membrane and external ear normal.     Left Ear: Tympanic membrane and external ear normal.     Nose: Nose normal.     Mouth/Throat:     Mouth: Mucous  membranes are moist.     Pharynx: Oropharynx is clear.  Eyes:     Conjunctiva/sclera: Conjunctivae normal.  Cardiovascular:     Rate and Rhythm: Normal rate and regular rhythm.     Heart sounds: Normal heart sounds. No murmur heard.    No friction rub. No gallop.  Pulmonary:     Effort: Pulmonary effort is normal.     Breath sounds: Normal breath sounds. No wheezing, rhonchi or rales.  Musculoskeletal:     Cervical back: Neck supple.  Skin:    General: Skin is warm.     Findings: No rash.  Neurological:     Mental Status: She is alert and oriented to person, place, and time.  Psychiatric:        Behavior: Behavior normal.    Previous notes and tests were reviewed. The plan was reviewed with the patient/family, and all questions/concerned were addressed.  It was my pleasure to see Sonia Cruz today and participate in her care. Please feel free to contact me with any questions or concerns.  Sincerely,  Orlan Cramp, DO Allergy & Immunology  [facility]  [facility] office: [phone] [facility] office: [phone]    [1]  Allergies Allergen Reactions   Porcine (Pork) Protein-Containing Drug Products Hives   "

## 2024-09-19 ENCOUNTER — Ambulatory Visit: Admitting: Allergy

## 2024-09-19 ENCOUNTER — Encounter: Payer: Self-pay | Admitting: Allergy

## 2024-09-19 ENCOUNTER — Other Ambulatory Visit: Payer: Self-pay

## 2024-09-19 VITALS — BP 122/70 | HR 81 | Temp 98.1°F | Resp 18 | Wt 216.8 lb

## 2024-09-19 DIAGNOSIS — J302 Other seasonal allergic rhinitis: Secondary | ICD-10-CM | POA: Diagnosis not present

## 2024-09-19 DIAGNOSIS — T7819XD Other adverse food reactions, not elsewhere classified, subsequent encounter: Secondary | ICD-10-CM | POA: Diagnosis not present

## 2024-09-19 DIAGNOSIS — L503 Dermatographic urticaria: Secondary | ICD-10-CM

## 2024-09-19 DIAGNOSIS — L508 Other urticaria: Secondary | ICD-10-CM | POA: Diagnosis not present

## 2024-09-19 DIAGNOSIS — H1010 Acute atopic conjunctivitis, unspecified eye: Secondary | ICD-10-CM | POA: Diagnosis not present

## 2024-09-19 DIAGNOSIS — J3089 Other allergic rhinitis: Secondary | ICD-10-CM | POA: Diagnosis not present

## 2024-09-19 NOTE — Patient Instructions (Addendum)
 Hives: Try take Xyzal 5mg  twice a day. Take allegra 180mg  once a day in the mornings.  If symptoms are not controlled or causes drowsiness let us  know. Avoid the following potential triggers: alcohol, tight clothing, NSAIDs, hot showers and getting overheated. Continue proper skin care.   Foods Recommend to avoid pork.  Okay to reintroduce beef back into diet but if you notice rash/hives please start to avoid.   Environmental allergies 2022 labs positive to dust mites, grass pollen, cockroach.  Borderline positive to tree pollen and ragweed pollen. Continue environmental control measures.  The above antihistamines should also help with these symptoms. Nasal saline spray (i.e., Simply Saline) or nasal saline lavage (i.e., NeilMed) is recommended as needed and prior to medicated nasal sprays. Consider allergy injections for long term control if above medications do not help the symptoms.  Return in about 6 months (around 03/19/2025). Or sooner if needed.

## 2024-09-25 ENCOUNTER — Ambulatory Visit: Admitting: Podiatry

## 2024-09-25 ENCOUNTER — Encounter: Payer: Self-pay | Admitting: Podiatry

## 2024-09-25 VITALS — Ht 66.0 in | Wt 216.8 lb

## 2024-09-25 DIAGNOSIS — L601 Onycholysis: Secondary | ICD-10-CM

## 2024-09-25 DIAGNOSIS — L600 Ingrowing nail: Secondary | ICD-10-CM | POA: Diagnosis not present

## 2024-09-25 DIAGNOSIS — L603 Nail dystrophy: Secondary | ICD-10-CM

## 2024-09-25 MED ORDER — MUPIROCIN 2 % EX OINT: 1.0000 | TOPICAL_OINTMENT | Freq: Two times a day (BID) | CUTANEOUS | 2 refills | Status: AC

## 2024-09-25 NOTE — Patient Instructions (Signed)

## 2024-09-25 NOTE — Progress Notes (Signed)
 "      Subjective:  Patient ID: Sonia Cruz, female    DOB: [birth year],  MRN: [id number]  Sonia Cruz presents to clinic today for:  Chief Complaint  Patient presents with   Nail Problem    Right great toenail is lifted up, believes to needs to come off.  Patient comes in for above-stated complaint.  She was recently seen for onychomycosis.  The bilateral first toenails continue to cause patient pain and are beginning to lift up.  They cause pain and pressure with shoe gear.  Ingrowing borders noted.  She would like to discuss removal of the affected toenails.  PCP is Pcp, No.  Allergies[1]  Review of Systems: Negative except as noted in the HPI.  Objective:  There were no vitals filed for this visit.  Sonia Cruz is a pleasant 24 y.o. female in NAD. AAO x 3.  Vascular Examination: Capillary refill time is less than 3 seconds to toes bilateral. Palpable pedal pulses b/l LE. Digital hair present b/l. No pedal edema b/l. Skin temperature gradient WNL b/l. No varicosities b/l. No cyanosis or clubbing noted b/l.   Dermatological Examination: Bilateral first toenails are thickened, dystrophic, tender on direct dorsal palpation, lifting from nail bed with incurvated borders.  No sign of acute bacterial infection.  Neurological Examination: Protective sensation intact with Semmes-Weinstein 10 gram monofilament b/l LE. Vibratory sensation intact b/l LE.      No data to display           Assessment/Plan: 1. Nail dystrophy   2. Ingrown toenail of both feet     Meds ordered this encounter  Medications   mupirocin  ointment (BACTROBAN ) 2 %    Sig: Apply 1 Application topically 2 (two) times daily.    Dispense:  30 g    Refill:  2   # Bilateral ingrown toenails of first toes with nail dystrophy - Findings discussed with patient - Reviewed labs with patient which were negative for onychomycosis but showing hyperkeratinization of the toenails - Toenails causing pain with  shoe gear - We discussed temporary removal of the affected toenails in the hopes that they will grow out with improved appearance.  Patient agreeable to this after discussing risk, benefits alternative therapies - Aftercare instructions discussed at length - Written instructions dispensed - Soak feet in warm Epsom salts twice a day and apply bandage until dry scab forms.  Mupirocin  sent to patient's pharmacy to be applied to the nail avulsion sites. - Over-the-counter Tylenol and ibuprofen as needed for pain  Procedure: Temporary total nail avulsion of left and right first toe nails Discussed patient's condition today.  After obtaining patient consent, the left and right hallux was anesthetized with a 50:50 mixture of 1% lidocaine plain and 0.5% bupivacaine plain for a total of 3cc's administered to each toe.  Betadine skin prep upon confirmation of anesthesia, a freer elevator was utilized to free the bilateral hallux nail plate from the nail bed.  The nail border was then avulsed proximal to the eponychium and removed in toto.  The area was inspected for any remaining spicules.  No chemical matrixectomy was performed.  Alcohol lavage performed.  Antibiotic ointment and a DSD were applied, followed by a Coban dressing.  Patient tolerated the anesthetic and procedure well and will f/u in 2-3 weeks for recheck.  Patient given post-procedure instructions for daily 20-minute Epsom salt soaks, antibiotic ointment and daily use of Bandaids until toe starts to dry / form eschar.  Return in about 2 weeks (around 10/09/2024) for Nail Check.   Ethan Saddler, DPM, AACFAS [facility]     [address] [place], [place] [address]                Office [phone]  Fax [phone]      [1]  Allergies Allergen Reactions   Porcine (Pork) Protein-Containing Drug Products Hives   "

## 2024-10-06 ENCOUNTER — Encounter: Payer: Self-pay | Admitting: Podiatry

## 2024-10-06 ENCOUNTER — Ambulatory Visit: Admitting: Podiatry

## 2024-10-06 DIAGNOSIS — L600 Ingrowing nail: Secondary | ICD-10-CM | POA: Diagnosis not present

## 2024-10-06 NOTE — Patient Instructions (Signed)
 Look for urea 40% nail gel cream or ointment and apply to the thickened dry toenails. This can be bought over the counter, at a pharmacy or online such as [employer].  Apply to great toenails once the nails begin to grow back out if noticing thickened, dystrophic growth

## 2024-10-06 NOTE — Progress Notes (Unsigned)
 Total nail avulsion recheck some numbness bottom of toes but doing well no signs of infection

## 2024-10-13 ENCOUNTER — Ambulatory Visit: Admitting: Podiatry

## 2024-12-18 ENCOUNTER — Ambulatory Visit: Admitting: Neurology

## 2025-03-27 ENCOUNTER — Ambulatory Visit: Admitting: Allergy
